# Patient Record
Sex: Male | Born: 1955 | Race: Black or African American | Smoking: Never smoker
Health system: Southern US, Community
[De-identification: ages and names within clinical notes are randomized; demographics above are authoritative.]

## PROBLEM LIST (undated history)

## (undated) DIAGNOSIS — E041 Nontoxic single thyroid nodule: Secondary | ICD-10-CM

## (undated) DIAGNOSIS — I10 Essential (primary) hypertension: Secondary | ICD-10-CM

---

## 2011-04-23 ENCOUNTER — Ambulatory Visit: Payer: Self-pay

## 2011-04-23 ENCOUNTER — Other Ambulatory Visit: Payer: Self-pay | Admitting: Occupational Medicine

## 2011-04-23 DIAGNOSIS — R52 Pain, unspecified: Secondary | ICD-10-CM

## 2012-12-02 IMAGING — CR DG ANKLE COMPLETE 3+V*R*
3 series · 3 of 3 positions shown · non-contrast
Comparison: Foot examination of same date.

***ADDENDUM*** CREATED: 04/23/2011 [DATE]

There is also a plantar calcaneal spur.
***END ADDENDUM*** SIGNED BY: Nilcilene Tenebroso
CLINICAL DATA: History of injury from fall with lateral pain.
RIGHT ANKLE - COMPLETE 3+ VIEW

[view not recorded (1 of 3)]
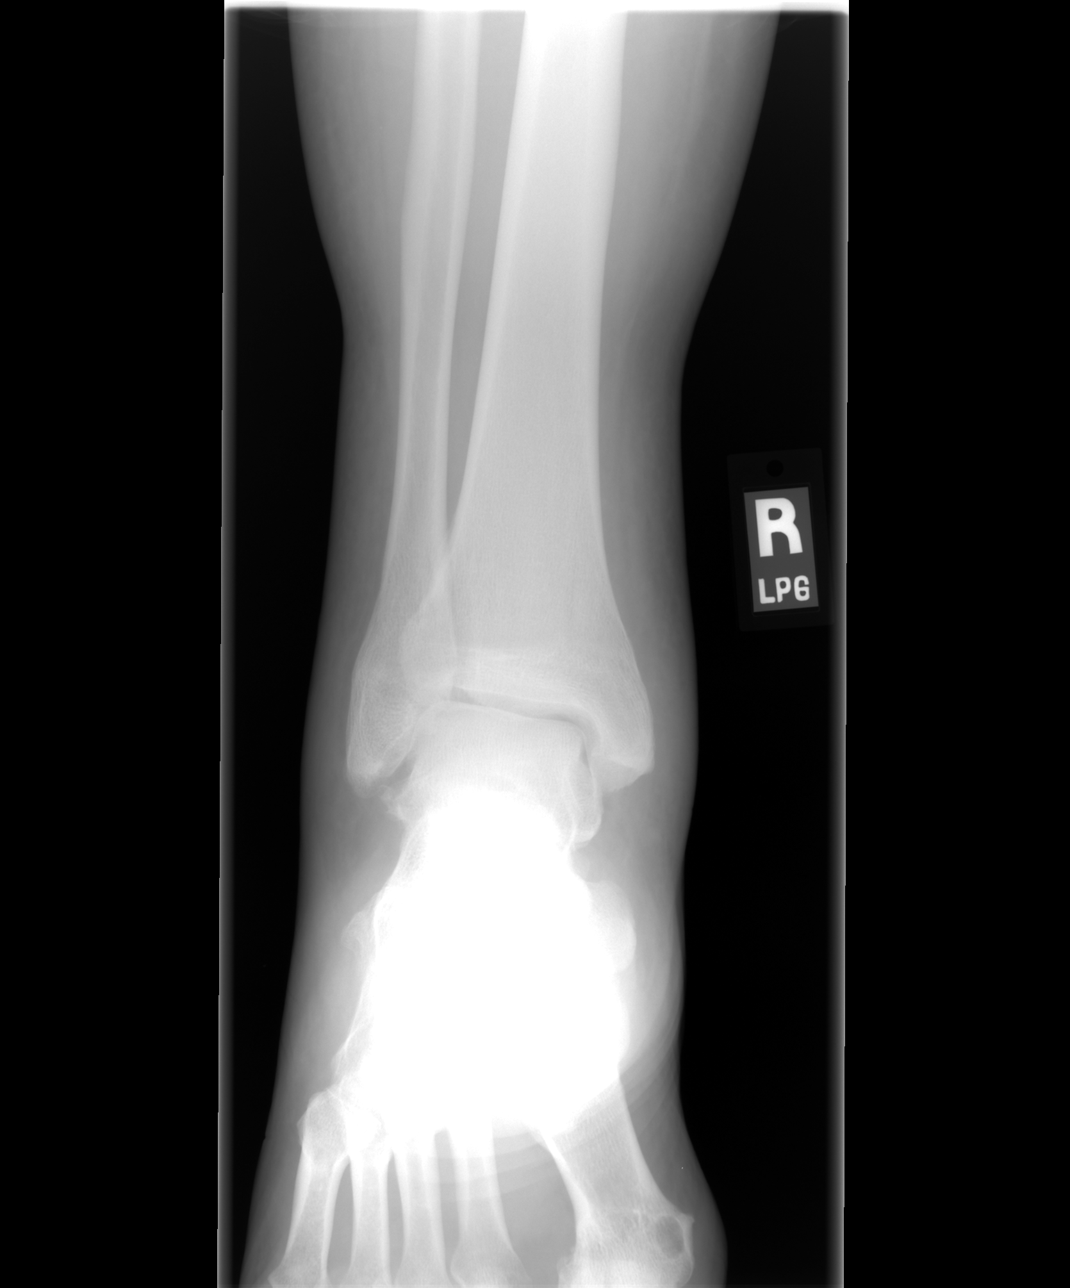

[view not recorded (2 of 3)]
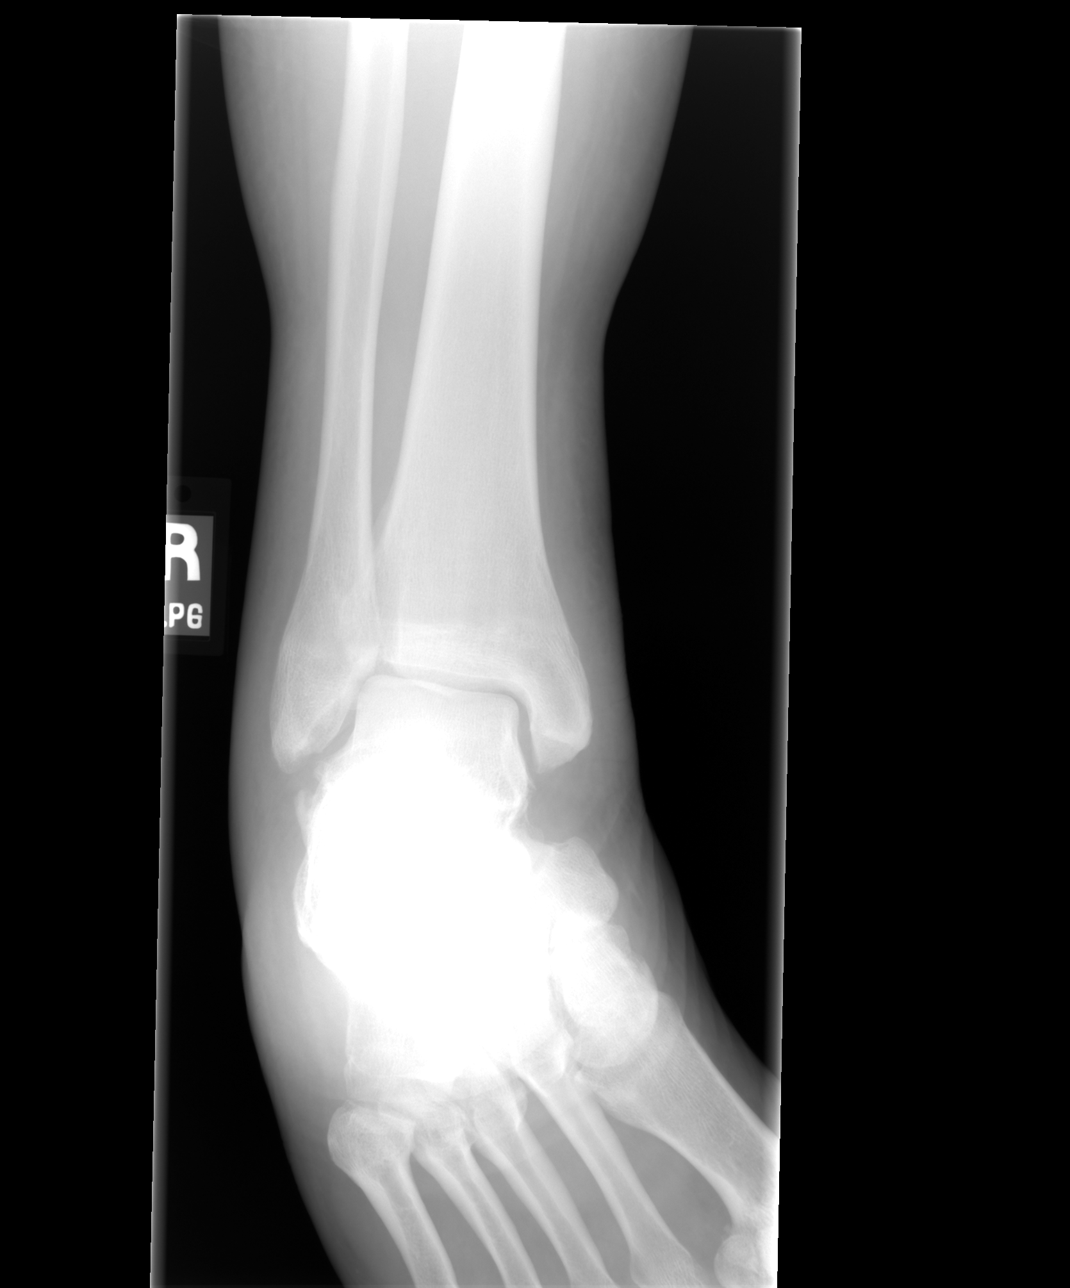

[view not recorded (3 of 3)]
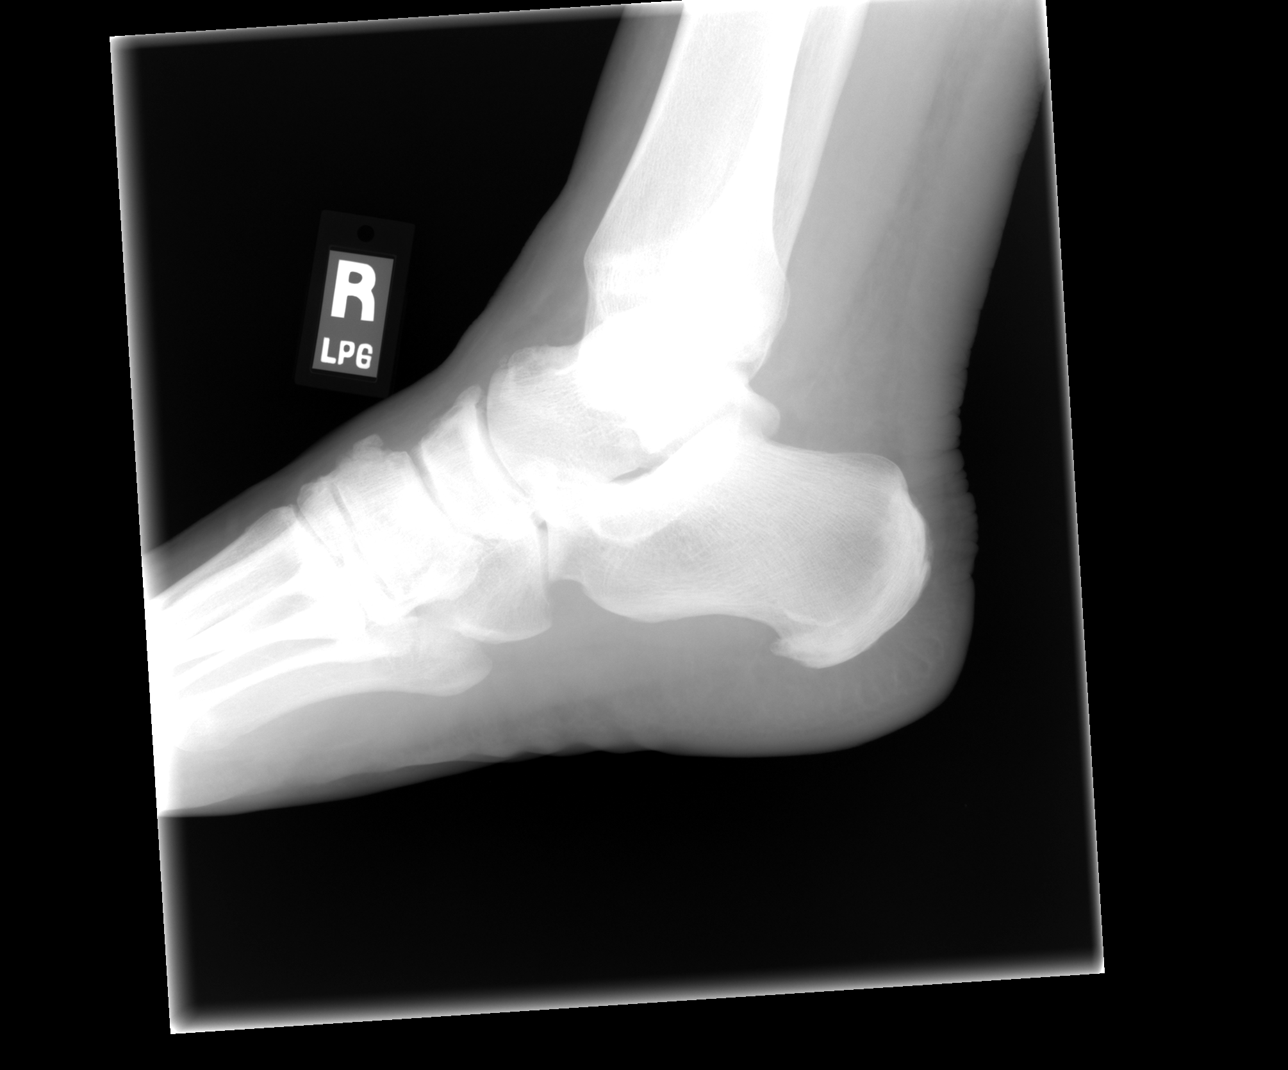

[3 of 3 positions shown; findings below may reference images not displayed]

FINDINGS: Mortise is preserved.  There is mild soft tissue
thickening or swelling.  No fracture or dislocation is evident.
Alignment is normal.  There is slight lateral spurring of the talus
and calcaneus.
IMPRESSION: Mild soft tissue swelling or thickening.  No fracture or
dislocation.

## 2012-12-02 IMAGING — CR DG FOOT COMPLETE 3+V*R*
3 series · 3 of 3 positions shown · non-contrast
Comparison: Right ankle examination of same date.

CLINICAL DATA: History of injury from fall.  Lateral foot pain.

RIGHT FOOT COMPLETE - 3+ VIEW

[view not recorded (1 of 3)]
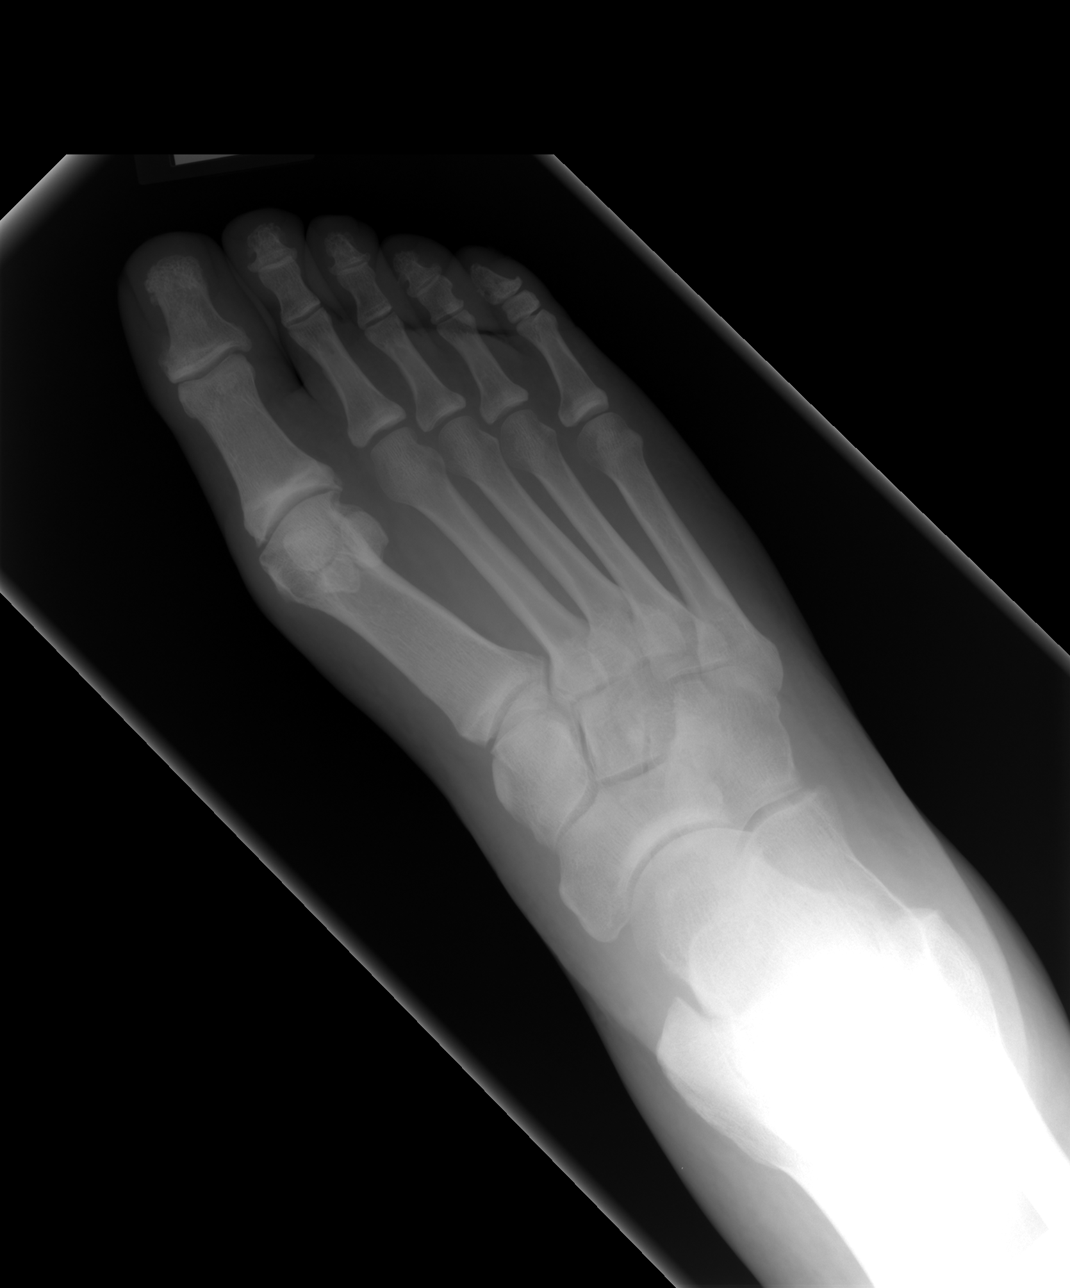

[view not recorded (2 of 3)]
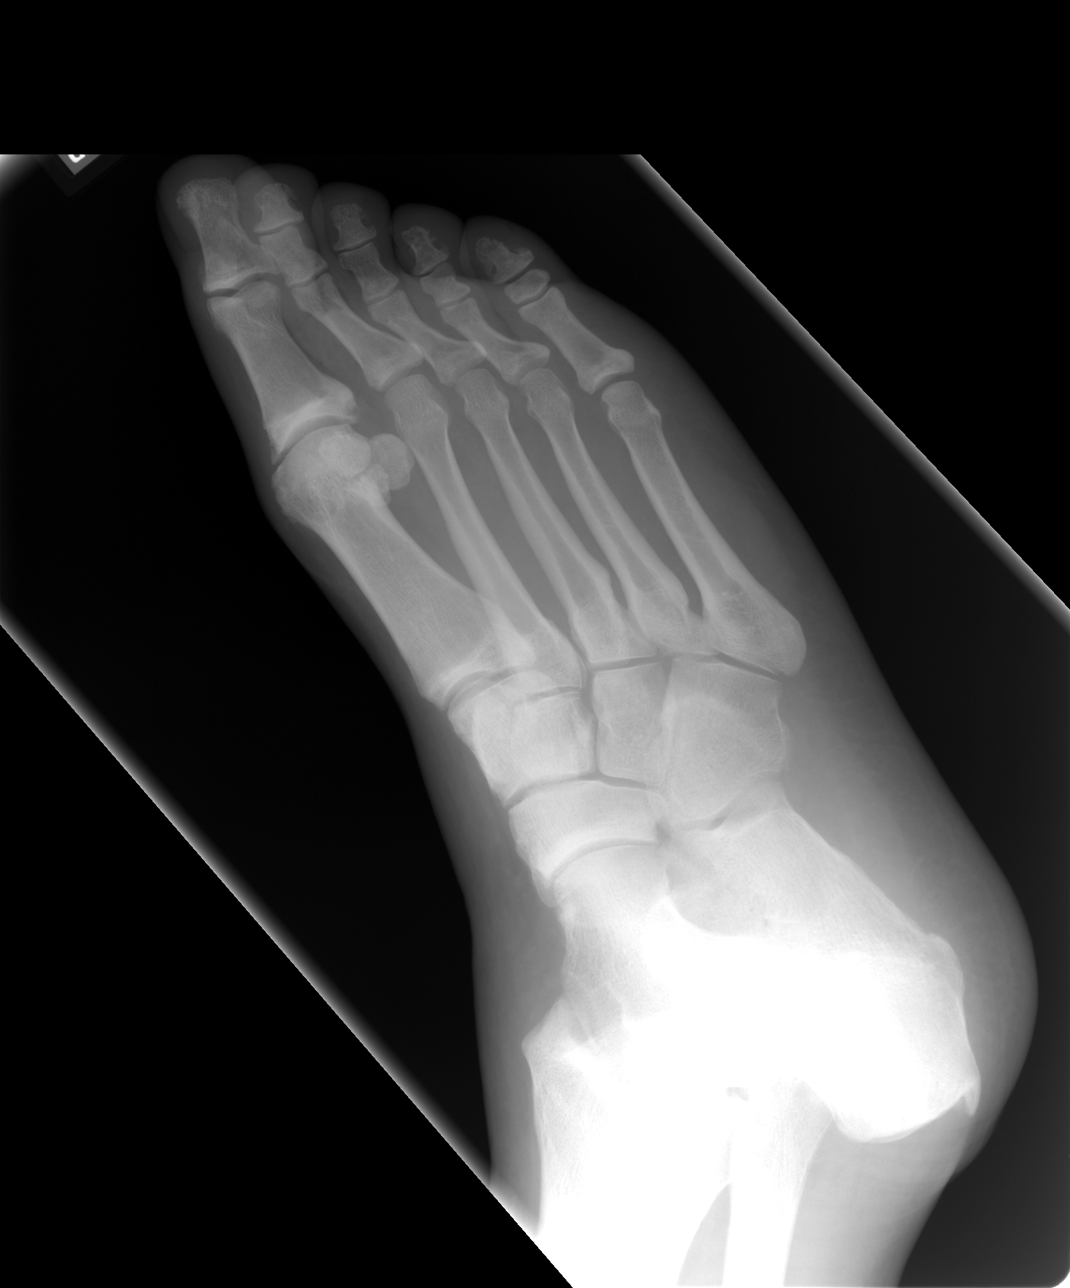

[view not recorded (3 of 3)]
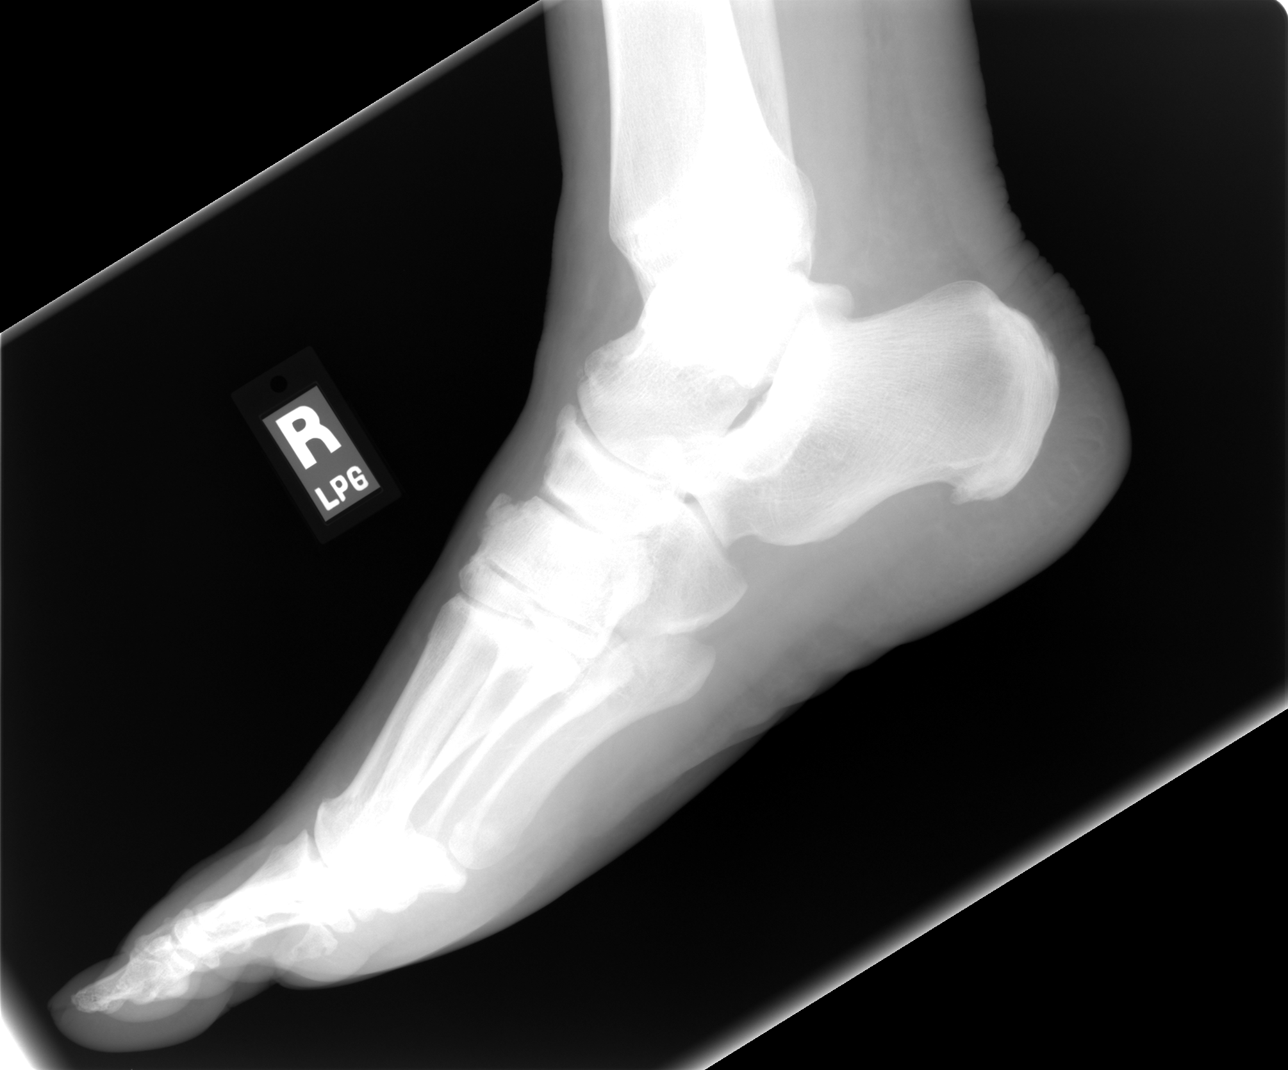

[3 of 3 positions shown; findings below may reference images not displayed]

FINDINGS: Alignment is normal.  No fracture or dislocation is
demonstrated.  There is narrowing of the first MTP joint with
marginal osteophyte formation representing osteoarthritic
degenerative joint disease. There is a plantar calcaneal spur.
IMPRESSION: No fracture dislocation.  First MTP joint osteoarthritic
degenerative joint changes.  Plantar calcaneal spur.

## 2013-11-07 ENCOUNTER — Other Ambulatory Visit: Payer: Self-pay | Admitting: Urology

## 2014-04-21 DIAGNOSIS — R3912 Poor urinary stream: Secondary | ICD-10-CM

## 2014-04-21 DIAGNOSIS — E042 Nontoxic multinodular goiter: Secondary | ICD-10-CM | POA: Insufficient documentation

## 2014-04-21 DIAGNOSIS — N401 Enlarged prostate with lower urinary tract symptoms: Secondary | ICD-10-CM | POA: Insufficient documentation

## 2014-05-11 DIAGNOSIS — N5201 Erectile dysfunction due to arterial insufficiency: Secondary | ICD-10-CM | POA: Insufficient documentation

## 2017-07-31 DIAGNOSIS — Z860101 Personal history of adenomatous and serrated colon polyps: Secondary | ICD-10-CM | POA: Insufficient documentation

## 2017-07-31 DIAGNOSIS — Z86010 Personal history of colonic polyps: Secondary | ICD-10-CM | POA: Insufficient documentation

## 2017-11-03 DIAGNOSIS — E669 Obesity, unspecified: Secondary | ICD-10-CM | POA: Insufficient documentation

## 2018-02-04 ENCOUNTER — Encounter: Payer: 59 | Admitting: Podiatry

## 2018-02-12 ENCOUNTER — Ambulatory Visit: Payer: 59

## 2018-02-12 ENCOUNTER — Ambulatory Visit: Payer: 59 | Admitting: Podiatry

## 2018-02-12 DIAGNOSIS — I10 Essential (primary) hypertension: Secondary | ICD-10-CM | POA: Insufficient documentation

## 2018-02-12 DIAGNOSIS — R7303 Prediabetes: Secondary | ICD-10-CM | POA: Insufficient documentation

## 2018-02-12 DIAGNOSIS — E785 Hyperlipidemia, unspecified: Secondary | ICD-10-CM | POA: Insufficient documentation

## 2018-02-12 DIAGNOSIS — Z9989 Dependence on other enabling machines and devices: Secondary | ICD-10-CM

## 2018-02-12 DIAGNOSIS — G4733 Obstructive sleep apnea (adult) (pediatric): Secondary | ICD-10-CM | POA: Insufficient documentation

## 2018-02-19 ENCOUNTER — Ambulatory Visit (INDEPENDENT_AMBULATORY_CARE_PROVIDER_SITE_OTHER): Payer: 59

## 2018-02-19 ENCOUNTER — Encounter: Payer: Self-pay | Admitting: Podiatry

## 2018-02-19 ENCOUNTER — Other Ambulatory Visit: Payer: Self-pay | Admitting: Podiatry

## 2018-02-19 ENCOUNTER — Ambulatory Visit: Payer: 59 | Admitting: Podiatry

## 2018-02-19 VITALS — BP 143/89 | HR 67 | Resp 16

## 2018-02-19 DIAGNOSIS — M722 Plantar fascial fibromatosis: Secondary | ICD-10-CM

## 2018-02-19 MED ORDER — TRIAMCINOLONE ACETONIDE 10 MG/ML IJ SUSP
10.0000 mg | Freq: Once | INTRAMUSCULAR | Status: AC
Start: 2018-02-19 — End: 2018-02-19
  Administered 2018-02-19: 10 mg

## 2018-02-19 MED ORDER — DICLOFENAC SODIUM 75 MG PO TBEC: 75.0000 mg | DELAYED_RELEASE_TABLET | Freq: Two times a day (BID) | ORAL | 2 refills | Status: DC

## 2018-02-19 NOTE — Patient Instructions (Signed)

## 2018-02-21 NOTE — Progress Notes (Signed)
Subjective:   Patient ID: Corey Hendricks, male   DOB: 62 y.o.   MRN: [id number]   HPI Patient presents with severe heel pain of both heels been present for several months and she had increased activity but does not remember any specific injury.  Patient does not smoke likes to be active   Review of Systems  All other systems reviewed and are negative.       Objective:  Physical Exam  Constitutional: He appears well-developed and well-nourished.  Cardiovascular: Intact distal pulses.  Pulmonary/Chest: Effort normal.  Musculoskeletal: Normal range of motion.  Neurological: He is alert.  Skin: Skin is warm.  Nursing note and vitals reviewed.   Neurovascular status intact muscle strength is adequate range of motion within normal limits with severe discomfort plantar aspect heel bilateral with inflammation fluid around the medial band.  Patient is found to have good digital perfusion and is well oriented x3     Assessment:  Acute plantar fasciitis bilateral heel with depression of the arch complicating the factor and the condition     Plan:  H&P x-ray reviewed and today I injected the plantar fascial bilateral 3 mg Kenalog 5 mg Xylocaine applied fascial brace bilateral gave instructions for physical therapy and supportive shoes and will be seen back again in the next several weeks  X-ray indicates that there is spur formation with no indication of stress fracture or advanced arthritis

## 2018-03-05 ENCOUNTER — Ambulatory Visit: Payer: 59 | Admitting: Podiatry

## 2018-03-12 ENCOUNTER — Ambulatory Visit: Payer: 59 | Admitting: Podiatry

## 2018-03-15 ENCOUNTER — Ambulatory Visit: Payer: 59 | Admitting: Podiatry

## 2018-05-24 NOTE — Progress Notes (Signed)
This encounter was created in error - please disregard.

## 2019-11-20 ENCOUNTER — Ambulatory Visit: Payer: 59 | Attending: Internal Medicine

## 2019-11-20 DIAGNOSIS — Z23 Encounter for immunization: Secondary | ICD-10-CM

## 2019-11-20 NOTE — Progress Notes (Signed)
   Covid-19 Vaccination Clinic  Name:  Corey Hendricks    MRN: [id number] DOB: 04-07-1956  11/20/2019  Corey Hendricks was observed post Covid-19 immunization for 15 minutes without incident. He was provided with Vaccine Information Sheet and instruction to access the V-Safe system.   Corey Hendricks was instructed to call 911 with any severe reactions post vaccine: Marland Kitchen Difficulty breathing  . Swelling of face and throat  . A fast heartbeat  . A bad rash all over body  . Dizziness and weakness   Immunizations Administered    Name Date Dose VIS Date Route   Pfizer COVID-19 Vaccine 11/20/2019 11:02 AM 0.3 mL 08/26/2019 Intramuscular   Manufacturer: [employer], [employer]   Lot: [id number]   NDC: [address]-1

## 2019-12-14 LAB — HEMOGLOBIN A1C: Hemoglobin A1C: 6 %

## 2019-12-14 LAB — TSH: TSH: 1.74 u[IU]/mL

## 2019-12-20 ENCOUNTER — Ambulatory Visit: Payer: 59 | Attending: Internal Medicine

## 2019-12-20 DIAGNOSIS — Z23 Encounter for immunization: Secondary | ICD-10-CM

## 2019-12-20 NOTE — Progress Notes (Signed)
   Covid-19 Vaccination Clinic  Name:  Jakyren Fluegge    MRN: [id number] DOB: 07/24/1956  12/20/2019  Mr. Yokley was observed post Covid-19 immunization for 15 minutes without incident. He was provided with Vaccine Information Sheet and instruction to access the V-Safe system.   Mr. Molina was instructed to call 911 with any severe reactions post vaccine: Marland Kitchen Difficulty breathing  . Swelling of face and throat  . A fast heartbeat  . A bad rash all over body  . Dizziness and weakness   Immunizations Administered    Name Date Dose VIS Date Route   Pfizer COVID-19 Vaccine 12/20/2019 11:37 AM 0.3 mL 08/26/2019 Intramuscular   Manufacturer: [employer], [employer]   Lot: [id number]   NDC: [address]-2

## 2020-06-20 LAB — CBC
Basophils %: 1 %
Basophils Absolute: 0.1 /??L
Eosinophils %: 2 %
Eosinophils Absolute: 0.1 /??L
Hematocrit: 45.5 % (ref 41–53)
Hemoglobin: 15 g/dL (ref 13.5–17.5)
Lymphocytes %: 34 %
Lymphocytes Absolute: 2.1 /??L
MCH: 26.9 pg
MCHC: 33 g/dL
MCV: 82 fL
Monocytes %: 9 %
Monocytes Absolute: 0.5 /??L
Neutrophils %: 54 %
Neutrophils Absolute: 3.3 /??L
Platelets: 164 K/??L
RBC: 5.57 10^6/??L
WBC: 6.2 10^3/mL

## 2020-06-20 LAB — COMPREHENSIVE METABOLIC PANEL
ALT: 26 U/L
AST: 22 U/L
Albumin: 4
Alkaline Phosphatase: 74 U/L
BUN: 8 mg/dL
CO2: 24 mmol/L
Calcium: 8.7 mg/dL
Chloride: 104 mmol/L
Creatinine: 1.04
Gfr Calculated: 77
Glucose: 94 mg/dL
Potassium: 4.2 mmol/L
Sodium: 140 mmol/L
Total Bilirubin: 0.4 mg/dL (ref 0.1–1.4)
Total Protein: 7.2

## 2020-06-20 LAB — HEMOGLOBIN A1C: Hemoglobin A1C: 6.1 %

## 2020-06-20 LAB — LIPID PANEL
Cholesterol, Total: 152 mg/dL
HDL: 41 mg/dL (ref 35–70)
LDL Calculated: 92 mg/dL (ref 0–160)
Triglycerides: 103 mg/dL
VLDL: 19 mg/dL

## 2020-06-20 LAB — PSA SCREENING: PSA: 0.6 ng/mL

## 2020-06-20 LAB — TSH: TSH: 1.09 u[IU]/mL

## 2021-04-12 ENCOUNTER — Ambulatory Visit
Admit: 2021-04-12 | Discharge: 2021-04-12 | Payer: BLUE CROSS/BLUE SHIELD | Attending: Physician Assistant | Primary: Physician Assistant

## 2021-04-12 DIAGNOSIS — Z0000 Encounter for general adult medical examination without abnormal findings: Secondary | ICD-10-CM

## 2021-04-12 NOTE — Progress Notes (Signed)
[facility]  [facility]  [address]  [address]  [place] [address]  Dept: [phone]  Dept Fax: [phone]    Theodore Webster is a 65 y.o. male who presents today for his medical conditions/complaints as noted below.    Chief Complaint   Patient presents with    Establish Care     Annual exam        HPI:     Patient presents to the office to establish care and for annual physical exam.  He is new to the area from [place].  He has past medical history including hypertension, obesity, obstructive sleep apnea.    Patient reports that he has been taking losartan 50 mg for the past few years for management of hypertension.  Denies any changes.  Denies any side effects.  Denies any lightheadedness, dizziness, shortness of breath, chest pain, leg edema, syncope.  Blood pressure in office today is slightly elevated.    He has history of obstructive sleep apnea.  He does not wear his CPAP device every night.    Patient reports that since moving to [place] he has gained around 10 pounds.  He states he has not been very active and conscious of dietary habits.  Non-smoker.  Minimal alcohol intake.    No specific concerns or complaints today.  He would like to obtain annual physical exam.  He reports he is up-to-date on vaccinations and health maintenance in [place].  Believes his last colonoscopy was in 2021.  I reviewed with patient his allergies come medications, past medical history, surgical history, family history, and social history.        No results found for: LABA1C          ( goal A1C is < 7)   No results found for: LABMICR  No results found for: LDLCHOLESTEROL, LDLCALC    (goal LDL is <100)   No results found for: AST, ALT, BUN, CR  BP Readings from Last 3 Encounters:   04/12/21 138/88          (goal 120/80)    Past Medical History:   Diagnosis Date    Hypertension       Past Surgical History:   Procedure Laterality Date    ACHILLES TENDON SURGERY  1992        Family History   Problem Relation Age of Onset    Diabetes Mother     High Blood Pressure Mother     Cancer Father        Social History     Tobacco Use    Smoking status: Never    Smokeless tobacco: Never   Substance Use Topics    Alcohol use: Not Currently     Comment: socially      Current Outpatient Medications   Medication Sig Dispense Refill    losartan (COZAAR) 50 MG tablet Take 50 mg by mouth in the morning.       No current facility-administered medications for this visit.     Allergies   Allergen Reactions    Penicillins Other (See Comments)     Unsure of reaction, childhood allergy       Health Maintenance   Topic Date Due    COVID-19 Vaccine (1) Never done    HIV screen  Never done    Hepatitis C screen  Never done    DTaP/Tdap/Td vaccine (1 - Tdap) Never done    Diabetes  screen  Never done    Lipids  Never done    Prostate Specific Antigen (PSA) Screening or Monitoring  Never done    Colorectal Cancer Screen  Never done    Shingles vaccine (1 of 2) Never done    Flu vaccine (1) 05/16/2021    Depression Screen  04/12/2022    Hepatitis A vaccine  Aged Out    Hepatitis B vaccine  Aged Out    Hib vaccine  Aged Out    Meningococcal (ACWY) vaccine  Aged Out    Pneumococcal 0-64 years Vaccine  Aged Out       Subjective:      Review of Systems   Constitutional:  Negative for chills, fatigue and fever.   HENT:  Negative for congestion, rhinorrhea and sinus pain.    Eyes:  Negative for pain.   Respiratory:  Negative for cough and shortness of breath.    Cardiovascular:  Negative for chest pain and leg swelling.   Gastrointestinal:  Negative for abdominal pain, constipation, diarrhea, nausea and vomiting.   Genitourinary:  Negative for difficulty urinating, enuresis and testicular pain.   Musculoskeletal:  Negative for arthralgias, back pain and myalgias.   Skin:  Negative for rash.   Neurological:  Negative for dizziness and headaches.   Psychiatric/Behavioral:  Negative for confusion and sleep disturbance.  The patient is not nervous/anxious.    All other systems reviewed and are negative.    Objective:     Physical Exam  Vitals and nursing note reviewed.   Constitutional:       General: He is not in acute distress.     Appearance: Normal appearance. He is obese.   HENT:      Head: Normocephalic.      Mouth/Throat:      Mouth: Mucous membranes are moist.   Eyes:      Extraocular Movements: Extraocular movements intact.      Conjunctiva/sclera: Conjunctivae normal.      Pupils: Pupils are equal, round, and reactive to light.   Cardiovascular:      Rate and Rhythm: Normal rate and regular rhythm.      Pulses: Normal pulses.      Heart sounds: Normal heart sounds.   Pulmonary:      Effort: Pulmonary effort is normal.      Breath sounds: Normal breath sounds.   Abdominal:      General: Abdomen is flat. Bowel sounds are normal.      Palpations: Abdomen is soft.      Tenderness: There is no abdominal tenderness.   Musculoskeletal:      Cervical back: Normal range of motion.      Right lower leg: No edema.      Left lower leg: No edema.   Lymphadenopathy:      Cervical: No cervical adenopathy.   Skin:     General: Skin is warm.      Capillary Refill: Capillary refill takes less than 2 seconds.   Neurological:      General: No focal deficit present.      Mental Status: He is alert and oriented to person, place, and time.   Psychiatric:         Mood and Affect: Mood normal.         Behavior: Behavior normal.     BP 138/88 (Site: Left Upper Arm, Position: Sitting, Cuff Size: Large Adult)    Pulse 67    Resp 16  Ht 6\' 2"  (1.88 m)    Wt (!) 301 lb (136.5 kg) Comment: 301.8 lb   SpO2 95%    BMI 38.65 kg/m??     Assessment:       ICD-10-CM    1. Annual physical exam  Z00.00       2. Encounter to establish care  Z76.89       3. Primary hypertension  I10       4. Screening for diabetes mellitus  Z13.1 Hemoglobin A1C      5. Screening PSA (prostate specific antigen)  Z12.5 PSA Screening      6. Screening for deficiency anemia  Z13.0  CBC with Auto Differential      7. Screening for thyroid disorder  Z13.29 TSH with Reflex      8. Encounter for lipid screening for cardiovascular disease  Z13.220 Lipid Panel    Z13.6 Comprehensive Metabolic Panel               Plan:      1,2.  Presents the office to establish care and for annual physical exam.  3.  Patient with history of hypertension.  He is to continue current losartan 50 mg once daily.  In addition, I discussed the importance of weight reduction, physical activity, sodium reduction, increasing potassium rich foods.  I recommended he obtain home blood pressure cuff and track BP regularly.4  4.  I discussed with patient the importance of CPAP device.  Advised that untreated sleep apnea can lead to elevated blood pressure and chronic diseases.  5-9.  Patient agreeable to several screening labs.  He has not had blood work in over a year.  Advised patient that we will most likely bring him back to the office after labs resulted.  10.  We discussed obesity and its role in chronic medical diseases.  I encouraged reducing carbohydrate intake, portion sizes, increasing physical activity.    Return in about 3 months (around 07/13/2021) for blood pressure, medication recheck.    Orders Placed This Encounter   Procedures    CBC with Auto Differential     Standing Status:   Future     Standing Expiration Date:   04/12/2022    TSH with Reflex     Standing Status:   Future     Standing Expiration Date:   04/12/2022    Lipid Panel     Standing Status:   Future     Standing Expiration Date:   07/13/2021     Order Specific Question:   Is Patient Fasting?/# of Hours     Answer:   Fast 8-10 hours    Comprehensive Metabolic Panel     Standing Status:   Future     Standing Expiration Date:   04/12/2022    PSA Screening     Standing Status:   Future     Standing Expiration Date:   04/12/2022    Hemoglobin A1C     Standing Status:   Future     Standing Expiration Date:   04/12/2022           Patient given educational  materials - see patient instructions.  Discussed use, benefit, and side effects of prescribed medications.  All patient questions answered. Pt voiced understanding. Reviewed health maintenance.  Instructed to continue current medications, diet and exercise.  Patient agreed with treatment plan. Follow up as directed.        Electronically signed by 04/14/2022, PA-C on 04/12/2021  at 11:22 AM

## 2021-04-18 ENCOUNTER — Inpatient Hospital Stay: Payer: BLUE CROSS/BLUE SHIELD | Primary: Physician Assistant

## 2021-04-18 DIAGNOSIS — Z125 Encounter for screening for malignant neoplasm of prostate: Secondary | ICD-10-CM

## 2021-04-18 LAB — CBC WITH AUTO DIFFERENTIAL
Absolute Eos #: 0.14 10*3/uL (ref 0.00–0.44)
Absolute Immature Granulocyte: <0.03 10*3/uL (ref 0.00–0.30)
Absolute Lymph #: 2.43 10*3/uL (ref 1.10–3.70)
Absolute Mono #: 0.6 10*3/uL (ref 0.10–1.20)
Basophils Absolute: 0.04 10*3/uL (ref 0.00–0.20)
Basophils: 1 % (ref 0–2)
Eosinophils %: 2 % (ref 1–4)
Hematocrit: 48.6 % (ref 40.7–50.3)
Hemoglobin: 14.8 g/dL (ref 13.0–17.0)
Immature Granulocytes: 0 %
Lymphocytes: 36 % (ref 24–43)
MCH: 26.8 pg (ref 25.2–33.5)
MCHC: 30.5 g/dL (ref 28.4–34.8)
MCV: 87.9 fL (ref 82.6–102.9)
MPV: 11.5 fL (ref 8.1–13.5)
Monocytes: 9 % (ref 3–12)
NRBC Automated: 0 per 100 WBC
Platelets: 179 10*3/uL (ref 138–453)
RBC: 5.53 m/uL (ref 4.21–5.77)
RDW: 15.6 % — ABNORMAL HIGH (ref 11.8–14.4)
Seg Neutrophils: 52 % (ref 36–65)
Segs Absolute: 3.59 10*3/uL (ref 1.50–8.10)
WBC: 6.8 10*3/uL (ref 3.5–11.3)

## 2021-04-18 LAB — COMPREHENSIVE METABOLIC PANEL
ALT: 35 U/L (ref 5–41)
AST: 44 U/L — ABNORMAL HIGH (ref <40)
Albumin/Globulin Ratio: 1.3 (ref 1.0–2.5)
Albumin: 4 g/dL (ref 3.5–5.2)
Alkaline Phosphatase: 63 U/L (ref 40–129)
Anion Gap: 12 mmol/L (ref 9–17)
BUN: 12 mg/dL (ref 8–23)
CO2: 23 mmol/L (ref 20–31)
Calcium: 8.7 mg/dL (ref 8.6–10.4)
Chloride: 103 mmol/L (ref 98–107)
Creatinine: 1.06 mg/dL (ref 0.70–1.20)
GFR African American: >60 mL/min (ref >60)
GFR Non-African American: >60 mL/min (ref >60)
Glucose: 101 mg/dL — ABNORMAL HIGH (ref 70–99)
Potassium: 5.1 mmol/L (ref 3.7–5.3)
Sodium: 138 mmol/L (ref 135–144)
Total Bilirubin: 0.46 mg/dL (ref 0.3–1.2)
Total Protein: 7.1 g/dL (ref 6.4–8.3)

## 2021-04-18 LAB — HEMOGLOBIN A1C
Estimated Avg Glucose: 128 mg/dL
Hemoglobin A1C: 6.1 % — ABNORMAL HIGH (ref 4.0–6.0)

## 2021-04-18 LAB — LIPID PANEL
Chol/HDL Ratio: 3.7 (ref <5)
Cholesterol: 156 mg/dL (ref <200)
HDL: 42 mg/dL (ref >40)
LDL Cholesterol: 92 mg/dL (ref 0–130)
Triglycerides: 112 mg/dL (ref <150)

## 2021-04-18 LAB — PSA SCREENING: PSA: 0.63 ng/mL (ref <4.1)

## 2021-04-18 LAB — TSH WITH REFLEX: TSH: 1.66 u[IU]/mL (ref 0.30–5.00)

## 2021-07-12 ENCOUNTER — Encounter: Attending: Physician Assistant | Primary: Physician Assistant

## 2021-07-12 ENCOUNTER — Encounter: Payer: BLUE CROSS/BLUE SHIELD | Attending: Physician Assistant | Primary: Physician Assistant

## 2021-07-18 ENCOUNTER — Ambulatory Visit
Admit: 2021-07-18 | Discharge: 2021-07-18 | Payer: BLUE CROSS/BLUE SHIELD | Attending: Physician Assistant | Primary: Physician Assistant

## 2021-07-18 DIAGNOSIS — I10 Essential (primary) hypertension: Secondary | ICD-10-CM

## 2021-07-18 NOTE — Progress Notes (Signed)
[facility]  [facility] [place] [facility]  [address]  [place] [address]  Dept: [phone]  Dept Fax: [phone]    Theodore Webster is a 65 y.o. male who presents today for his medical conditions/complaints as noted below.    Chief Complaint   Patient presents with    Hypertension       HPI:     Patient presents to the office for routine follow-up.  He has past medical history including hypertension, obstructive sleep apnea, obesity, prediabetes.    Today, patient reports he is doing very well without any new or acute concerns.  We reviewed most recent blood work which demonstrates A1c of 6.1.  Remainder within normal limits.  He denies any lightheadedness, dizziness, shortness of breath, chest pain, leg edema, syncope.  Weight has increased 5 pounds since last office visit.  He reports he has not maintained test of dietary habits.  Blood pressure today is borderline elevated.  He reports he did not take his losartan this morning.  He is also not wearing his CPAP device nightly.      Hemoglobin A1C (%)   Date Value   04/18/2021 6.1 (H)   06/20/2020 6.1   12/14/2019 6.0             ( goal A1C is < 7)   No results found for: LABMICR  LDL Cholesterol (mg/dL)   Date Value   56/97/9480 92     LDL Calculated (mg/dL)   Date Value   16/55/3748 92       (goal LDL is <100)   AST (U/L)   Date Value   04/18/2021 44 (H)     ALT (U/L)   Date Value   04/18/2021 35     BUN (mg/dL)   Date Value   27/03/8674 12     BP Readings from Last 3 Encounters:   07/18/21 138/88   04/12/21 138/88          (goal 120/80)    Past Medical History:   Diagnosis Date    Hypertension       Past Surgical History:   Procedure Laterality Date    ACHILLES TENDON SURGERY  1992       Family History   Problem Relation Age of Onset    Diabetes Mother     High Blood Pressure Mother     Cancer Father        Social History     Tobacco Use    Smoking status: Never    Smokeless tobacco: Never   Substance Use Topics    Alcohol use: Not  Currently     Comment: socially      Current Outpatient Medications   Medication Sig Dispense Refill    losartan (COZAAR) 50 MG tablet Take 50 mg by mouth in the morning.       No current facility-administered medications for this visit.     Allergies   Allergen Reactions    Penicillins Other (See Comments)     Unsure of reaction, childhood allergy       Health Maintenance   Topic Date Due    HIV screen  Never done    Hepatitis C screen  Never done    DTaP/Tdap/Td vaccine (1 - Tdap) Never done    Colorectal Cancer Screen  Never done    Shingles vaccine (1 of 2) Never done    COVID-19 Vaccine (2 - Pfizer series) 08/02/2021  A1C test (Diabetic or Prediabetic)  04/18/2022    Depression Screen  07/18/2022    Lipids  04/18/2026    Flu vaccine  Completed    Hepatitis A vaccine  Aged Out    Hib vaccine  Aged Out    Meningococcal (ACWY) vaccine  Aged Out    Pneumococcal 0-64 years Vaccine  Aged Out       Subjective:      Review of Systems   Constitutional:  Negative for chills, fatigue and fever.   HENT:  Negative for congestion, rhinorrhea and sinus pain.    Eyes:  Negative for pain.   Respiratory:  Negative for cough and shortness of breath.    Cardiovascular:  Negative for chest pain and leg swelling.   Gastrointestinal:  Negative for abdominal pain, constipation, diarrhea, nausea and vomiting.   Genitourinary:  Negative for difficulty urinating, enuresis and testicular pain.   Musculoskeletal:  Negative for arthralgias, back pain and myalgias.   Skin:  Negative for rash.   Neurological:  Negative for dizziness and headaches.   Psychiatric/Behavioral:  Negative for confusion and sleep disturbance. The patient is not nervous/anxious.    All other systems reviewed and are negative.    Objective:     Physical Exam  Vitals and nursing note reviewed.   Constitutional:       General: He is not in acute distress.     Appearance: Normal appearance.   HENT:      Head: Normocephalic.      Mouth/Throat:      Mouth: Mucous  membranes are moist.   Eyes:      Extraocular Movements: Extraocular movements intact.      Conjunctiva/sclera: Conjunctivae normal.      Pupils: Pupils are equal, round, and reactive to light.   Cardiovascular:      Rate and Rhythm: Normal rate and regular rhythm.      Pulses: Normal pulses.      Heart sounds: Normal heart sounds.   Pulmonary:      Effort: Pulmonary effort is normal.      Breath sounds: Normal breath sounds.   Abdominal:      General: Abdomen is flat.   Musculoskeletal:      Cervical back: Normal range of motion.      Right lower leg: No edema.      Left lower leg: No edema.   Lymphadenopathy:      Cervical: No cervical adenopathy.   Skin:     General: Skin is warm.      Capillary Refill: Capillary refill takes less than 2 seconds.   Neurological:      General: No focal deficit present.      Mental Status: He is alert and oriented to person, place, and time.   Psychiatric:         Mood and Affect: Mood normal.         Behavior: Behavior normal.     BP 138/88 (Site: Left Upper Arm, Position: Sitting, Cuff Size: Large Adult)    Pulse 71    Resp 16    Ht 6\' 2"  (1.88 m)    Wt (!) 306 lb (138.8 kg)    SpO2 96%    BMI 39.29 kg/m??     Assessment:       ICD-10-CM    1. Primary hypertension  I10       2. Prediabetes  R73.03       3. Class 2 severe obesity  due to excess calories with serious comorbidity and body mass index (BMI) of 38.0 to 38.9 in adult (HCC)  E66.01     Z68.38                Plan:      1.  Hypertension is borderline.  We discussed the importance of compliance with losartan 50 mg daily.  I discussed importance of compliance with CPAP device for obstructive sleep apnea.  I encourage patient on DASH diet.  2.  We discussed prediabetes.  He defers starting medication at this time such as metformin.  I strongly advise monitoring for excess sugars and carbohydrates.  3.  We discussed management of weight and its important for chronic medical diseases.  I strongly encouraged 30 minutes of daily  activity.    Return in about 4 months (around 11/15/2021) for medication recheck, blood pressure, prediabetes (A1C).    No orders of the defined types were placed in this encounter.        Patient given educational materials - see patient instructions.  Discussed use, benefit, and side effects of prescribed medications.  All patient questions answered. Pt voiced understanding. Reviewed health maintenance.  Instructed to continue current medications, diet and exercise.  Patient agreed with treatment plan. Follow up as directed.        Electronically signed by Madelyn Brunner, PA-C on 07/18/2021 at 3:41 PM

## 2021-09-27 ENCOUNTER — Encounter
Admit: 2021-09-27 | Discharge: 2021-09-27 | Payer: BLUE CROSS/BLUE SHIELD | Attending: Physician Assistant | Primary: Physician Assistant

## 2021-09-27 DIAGNOSIS — R7303 Prediabetes: Secondary | ICD-10-CM

## 2021-09-27 NOTE — Progress Notes (Signed)
[facility]  [facility] [place]  [address]  [place] [id number]  Dept: [phone]  Dept Fax: [phone]    Theodore Webster is a 66 y.o. male who presents today for his medical conditions/complaints as noted below.    Chief Complaint   Patient presents with    Thyroid Problem     Follow up       HPI:     Patient presents to the office to discuss thyroid concern.    He states he has noticed enlarged thyroid over the last few months.  He feels full sense of fullness when swallowing.  He reports back in 2019 he was seeing endocrinology due to thyroid issue.  He had ultrasound and biopsy.  He reports biopsy was benign growth.  He has not had any recent imaging.  Denies any symptoms of hyper or hypothyroidism including weight changes, bowel movement changes, energy level changes, elevated heart rate, anxiety.    Blood pressure is borderline today.  Denies any lightheadedness, dizziness, shortness of breath, chest pain, leg edema, syncope.  Weight slightly decreased from last office visit.      Hemoglobin A1C (%)   Date Value   04/18/2021 6.1 (H)   06/20/2020 6.1   12/14/2019 6.0             ( goal A1C is < 7)   No results found for: LABMICR  LDL Cholesterol (mg/dL)   Date Value   84/69/629508/12/2020 92     LDL Calculated (mg/dL)   Date Value   28/41/324410/02/2020 92       (goal LDL is <100)   AST (U/L)   Date Value   04/18/2021 44 (H)     ALT (U/L)   Date Value   04/18/2021 35     BUN (mg/dL)   Date Value   01/02/725308/12/2020 12     BP Readings from Last 3 Encounters:   09/27/21 130/86   07/18/21 138/88   04/12/21 138/88          (goal 120/80)    Past Medical History:   Diagnosis Date    Hypertension       Past Surgical History:   Procedure Laterality Date    ACHILLES TENDON SURGERY  1992       Family History   Problem Relation Age of Onset    Diabetes Mother     High Blood Pressure Mother     Cancer Father        Social History     Tobacco Use    Smoking status: Never    Smokeless tobacco: Never   Substance  Use Topics    Alcohol use: Not Currently     Comment: socially      Current Outpatient Medications   Medication Sig Dispense Refill    losartan (COZAAR) 50 MG tablet Take 50 mg by mouth in the morning.       No current facility-administered medications for this visit.     Allergies   Allergen Reactions    Penicillins Other (See Comments)     Unsure of reaction, childhood allergy       Health Maintenance   Topic Date Due    HIV screen  Never done    Hepatitis C screen  Never done    DTaP/Tdap/Td vaccine (1 - Tdap) Never done    Colorectal Cancer Screen  Never done    Shingles vaccine (1 of 2) Never done  COVID-19 Vaccine (2 - Pfizer series) 08/02/2021    A1C test (Diabetic or Prediabetic)  04/18/2022    Depression Screen  09/27/2022    Lipids  04/18/2026    Flu vaccine  Completed    Hepatitis A vaccine  Aged Out    Hib vaccine  Aged Out    Meningococcal (ACWY) vaccine  Aged Out    Pneumococcal 0-64 years Vaccine  Aged Out       Subjective:      Review of Systems   Constitutional:  Negative for chills, fatigue and fever.   HENT:  Negative for congestion, rhinorrhea and sinus pain.         + neck enlargment   Eyes:  Negative for pain.   Respiratory:  Negative for cough and shortness of breath.    Cardiovascular:  Negative for chest pain and leg swelling.   Gastrointestinal:  Negative for abdominal pain, constipation, diarrhea, nausea and vomiting.   Genitourinary:  Negative for difficulty urinating, enuresis and testicular pain.   Musculoskeletal:  Negative for arthralgias, back pain and myalgias.   Skin:  Negative for rash.   Neurological:  Negative for dizziness and headaches.   Psychiatric/Behavioral:  Negative for confusion and sleep disturbance. The patient is not nervous/anxious.    All other systems reviewed and are negative.    Objective:     Physical Exam  Vitals and nursing note reviewed.   Constitutional:       General: He is not in acute distress.     Appearance: Normal appearance. He is obese.   HENT:       Head: Normocephalic.      Right Ear: External ear normal.      Left Ear: External ear normal.      Nose: Nose normal. No congestion.      Mouth/Throat:      Mouth: Mucous membranes are moist.   Eyes:      Extraocular Movements: Extraocular movements intact.      Conjunctiva/sclera: Conjunctivae normal.      Pupils: Pupils are equal, round, and reactive to light.   Neck:      Thyroid: Thyromegaly and thyroid tenderness (minimal) present.   Cardiovascular:      Rate and Rhythm: Normal rate and regular rhythm.      Pulses: Normal pulses.      Heart sounds: Normal heart sounds.   Pulmonary:      Effort: Pulmonary effort is normal.      Breath sounds: Normal breath sounds.   Abdominal:      General: Abdomen is flat. Bowel sounds are normal.      Palpations: Abdomen is soft.      Tenderness: There is no abdominal tenderness.   Musculoskeletal:      Cervical back: Normal range of motion. No pain with movement, spinous process tenderness or muscular tenderness.      Right lower leg: No edema.      Left lower leg: No edema.   Lymphadenopathy:      Cervical: No cervical adenopathy.   Skin:     General: Skin is warm.      Capillary Refill: Capillary refill takes less than 2 seconds.   Neurological:      General: No focal deficit present.      Mental Status: He is alert and oriented to person, place, and time.   Psychiatric:         Mood and Affect: Mood normal.  Behavior: Behavior normal.     BP 130/86 (Site: Left Upper Arm, Position: Sitting, Cuff Size: Large Adult)    Pulse 66    Resp 16    Ht 6\' 2"  (1.88 m)    Wt 299 lb 4.8 oz (135.8 kg)    SpO2 95%    BMI 38.43 kg/m??     Assessment:       ICD-10-CM    1. Thyroid enlargement  E04.9 THYROID     TSH     T4, Free     T3, Free      2. Prediabetes  R73.03       3. Primary hypertension  I10                Plan:      1.  Patient with thyroid enlargement and history of biopsy.  Plan to reimage thyroid with ultrasound and assess thyroid function.  Pending results will  refer to Endo if necessary.  2.  With a long discussion about management of prediabetes.  I did encourage healthy dietary habits and active lifestyle.  3.  Patient with chronic hypertension.  Borderline today.  Continue losartan 50 mg daily.    Return if symptoms worsen or fail to improve.    Orders Placed This Encounter   Procedures    US THYROID     Standing Status:   Future     Standing Expiration Date:   09/27/2022     Order Specific Question:   Reason for exam:     Answer:   nodule/enlargement    TSH     Standing Status:   Future     Standing Expiration Date:   09/27/2022    T4, Free     Standing Status:   Future     Standing Expiration Date:   09/27/2022    T3, Free     Standing Status:   Future     Standing Expiration Date:   09/27/2022         Patient given educational materials - see patient instructions.  Discussed use, benefit, and side effects of prescribed medications.  All patient questions answered. Pt voiced understanding. Reviewed health maintenance.  Instructed to continue current medications, diet and exercise.  Patient agreed with treatment plan. Follow up as directed.        Electronically signed by 09/29/2022, PA-C on 10/02/2021 at 7:01 AM

## 2021-10-02 ENCOUNTER — Encounter: Payer: BLUE CROSS/BLUE SHIELD | Primary: Physician Assistant

## 2021-10-02 ENCOUNTER — Encounter

## 2021-10-02 LAB — T3, FREE: T3, Free: 2.83 pg/mL (ref 2.02–4.43)

## 2021-10-02 LAB — T4, FREE: Thyroxine, Free: 0.97 ng/dL (ref 0.93–1.70)

## 2021-10-02 LAB — TSH: TSH: 1.17 u[IU]/mL (ref 0.30–5.00)

## 2021-10-04 ENCOUNTER — Encounter

## 2021-10-04 ENCOUNTER — Inpatient Hospital Stay: Admit: 2021-10-04 | Payer: BLUE CROSS/BLUE SHIELD | Primary: Physician Assistant

## 2021-10-04 DIAGNOSIS — E041 Nontoxic single thyroid nodule: Secondary | ICD-10-CM

## 2021-10-04 NOTE — Progress Notes (Signed)
Patient to [place] for thyroid biopsy.  Dr. Nicola Girt PA and [facility] RDMS at bedside.  Site prepped and draped on left side, area numbed with lidocaine.  Access obtained and 4 FNA given to pathology.  Access removed and band aid placed to site.   Site prepped and draped for right side, area numbed with lidocaine.  Access obtained and 3 FNA given to pathology.  Access removed and band aid placed to site.  Patient tolerated well and is ambulatory from dept.                                                                             Patient tolerated well and is ambulatory from dept. Ice pack given for comfort.

## 2021-10-07 ENCOUNTER — Ambulatory Visit: Payer: BLUE CROSS/BLUE SHIELD | Primary: Physician Assistant

## 2021-10-07 LAB — SURGICAL PATHOLOGY REPORT

## 2021-10-24 ENCOUNTER — Encounter
Admit: 2021-10-24 | Discharge: 2021-10-24 | Payer: BLUE CROSS/BLUE SHIELD | Attending: Physician Assistant | Primary: Physician Assistant

## 2021-10-24 DIAGNOSIS — I10 Essential (primary) hypertension: Secondary | ICD-10-CM

## 2021-10-24 MED ORDER — METHYLPREDNISOLONE 4 MG PO TBPK
4 MG | PACK | ORAL | 0 refills | Status: AC
Start: 2021-10-24 — End: 2021-10-30

## 2021-10-24 MED ORDER — LOSARTAN POTASSIUM-HCTZ 50-12.5 MG PO TABS
ORAL_TABLET | Freq: Every day | ORAL | 0 refills | Status: AC
Start: 2021-10-24 — End: 2022-03-27

## 2021-10-24 NOTE — Progress Notes (Signed)
[facility]  [place] [address]  Dept: [phone]  Dept Fax: [phone]    Theodore Webster is a 66 y.o. male who presents today for his medical conditions/complaints as noted below.    Chief Complaint   Patient presents with    Numbness     Left ring and pinky finger, left middle finger will involuntarily bend x 1 month       HPI:     Presents to the office for evaluation of left hand.  Specifically, patient is having issues with left middle finger getting "caught/bent" involuntarily.  This happens randomly throughout the day.  There is slight discomfort associated with the catching sensation.  No history of similar symptoms.  He also has concerns for left pinky finger numbness/tingling.  It is not persistent and mostly with position.  He does work sitdown job frequently typing.  No other hand or upper extremity symptoms.  No radiating pain or numbness.  No neck discomfort.  No other neurological concerns.  He is right-hand dominant.  No history of injury or trauma.    No other complaints or concerns.  BP borderline elevated today.  Weight stable.      Hemoglobin A1C (%)   Date Value   04/18/2021 6.1 (H)   06/20/2020 6.1   12/14/2019 6.0             ( goal A1C is < 7)   No results found for: LABMICR  LDL Cholesterol (mg/dL)   Date Value   04/18/2021 92     LDL Calculated (mg/dL)   Date Value   06/20/2020 92       (goal LDL is <100)   AST (U/L)   Date Value   04/18/2021 44 (H)     ALT (U/L)   Date Value   04/18/2021 35     BUN (mg/dL)   Date Value   04/18/2021 12     BP Readings from Last 3 Encounters:   10/24/21 (!) 138/92   09/27/21 130/86   07/18/21 138/88          (goal 120/80)    Past Medical History:   Diagnosis Date    Hypertension       Past Surgical History:   Procedure Laterality Date    ACHILLES TENDON SURGERY  1992    US THYROID BIOPSY  10/04/2021    US THYROID BIOPSY 10/04/2021 [facility]       Family History   Problem Relation Age  of Onset    Diabetes Mother     High Blood Pressure Mother     Cancer Father        Social History     Tobacco Use    Smoking status: Never    Smokeless tobacco: Never   Substance Use Topics    Alcohol use: Not Currently     Comment: socially      Current Outpatient Medications   Medication Sig Dispense Refill    losartan-hydroCHLOROthiazide (HYZAAR) 50-12.5 MG per tablet Take 1 tablet by mouth daily 30 tablet 0    methylPREDNISolone (MEDROL DOSEPACK) 4 MG tablet Take by mouth. 1 kit 0    losartan (COZAAR) 50 MG tablet Take 50 mg by mouth in the morning.       No current facility-administered medications for this visit.     Allergies   Allergen Reactions    Penicillins Other (See Comments)  Unsure of reaction, childhood allergy       Health Maintenance   Topic Date Due    HIV screen  Never done    Hepatitis C screen  Never done    DTaP/Tdap/Td vaccine (1 - Tdap) Never done    Colorectal Cancer Screen  Never done    Shingles vaccine (1 of 2) Never done    COVID-19 Vaccine (2 - Pfizer series) 08/02/2021    A1C test (Diabetic or Prediabetic)  04/18/2022    Depression Screen  10/24/2022    Lipids  04/18/2026    Flu vaccine  Completed    Hepatitis A vaccine  Aged Out    Hib vaccine  Aged Out    Meningococcal (ACWY) vaccine  Aged Out    Pneumococcal 0-64 years Vaccine  Aged Out       Subjective:      Review of Systems   Constitutional:  Negative for chills, fatigue and fever.   HENT:  Negative for congestion, rhinorrhea and sinus pain.    Eyes:  Negative for pain.   Respiratory:  Negative for cough and shortness of breath.    Cardiovascular:  Negative for chest pain and leg swelling.   Gastrointestinal:  Negative for abdominal pain, constipation, diarrhea, nausea and vomiting.   Genitourinary:  Negative for difficulty urinating, enuresis and testicular pain.   Musculoskeletal:  Positive for arthralgias (left middle finger). Negative for back pain and myalgias.   Skin:  Negative for rash.   Neurological:  Positive for  numbness (left small finger). Negative for dizziness and headaches.   Psychiatric/Behavioral:  Negative for confusion and sleep disturbance. The patient is not nervous/anxious.    All other systems reviewed and are negative.    Objective:     Physical Exam  Vitals and nursing note reviewed.   Constitutional:       General: He is not in acute distress.     Appearance: Normal appearance.   HENT:      Head: Normocephalic.      Mouth/Throat:      Mouth: Mucous membranes are moist.   Eyes:      Extraocular Movements: Extraocular movements intact.      Conjunctiva/sclera: Conjunctivae normal.      Pupils: Pupils are equal, round, and reactive to light.   Cardiovascular:      Rate and Rhythm: Normal rate and regular rhythm.      Pulses: Normal pulses.      Heart sounds: Normal heart sounds.   Pulmonary:      Effort: Pulmonary effort is normal. No respiratory distress.      Breath sounds: Normal breath sounds.   Musculoskeletal:      Cervical back: Normal range of motion.      Right lower leg: No edema.      Left lower leg: No edema.      Comments: Left hand: No erythema, edema, ecchymosis.  Good range of motion.  Good grip strength.  No significant nodule felt at the A1 pulley of the left middle finger.  Slight tenderness to the flexor region.  Mild catching with range of motion.  Neurovascular intact.  Left elbow: Good range of motion.  No tenderness.  Positive Tinel's over the cubital tunnel.  Negative Tinel's over the carpal tunnel.  Right elbow and hand are within normal limits.   Lymphadenopathy:      Cervical: No cervical adenopathy.   Skin:     General: Skin is warm.  Capillary Refill: Capillary refill takes less than 2 seconds.   Neurological:      General: No focal deficit present.      Mental Status: He is alert and oriented to person, place, and time.      Cranial Nerves: No cranial nerve deficit.      Motor: No weakness.      Gait: Gait normal.   Psychiatric:         Mood and Affect: Mood normal.          Behavior: Behavior normal.     BP (!) 138/92 (Site: Left Upper Arm, Position: Sitting, Cuff Size: Large Adult)    Pulse 72    Resp 16    Ht _0  (1.88 m)    Wt 297 lb 6.4 oz (134.9 kg)    SpO2 95%    BMI 38.18 kg/m??     Assessment:       ICD-10-CM    1. Primary hypertension  I10 losartan-hydroCHLOROthiazide (HYZAAR) 50-12.5 MG per tablet      2. 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  Hypertension remains borderline elevated.  Plan to change losartan to losartan-hydrochlorothiazide 50-12.5 mg daily.  In addition we discussed the importance of weight management, DASH diet.  I recommended he is check blood pressure at home on a routine basis so we can get a larger sample size of control.  2.  Patient was suspected trigger finger of the left middle finger.  We discussed massage and range of motion exercises.  3.  Patient with suspected ulnar neuropathy at the left elbow.  I recommended ergonomic changes at work and protective sleeve.  He will also be given Medrol Dosepak.  If trigger finger and neuropathy continue will refer to hand specialist.    Return in about 4 weeks (around 11/21/2021) for medication recheck, blood pressure.    No orders of the defined types were placed in this encounter.        Patient given educational materials - see patient instructions.  Discussed use, benefit, and side effects of prescribed medications.  All patient questions answered. Pt voiced understanding. Reviewed health maintenance.  Instructed to continue current medications, diet and exercise.  Patient agreed with treatment plan. Follow up as directed.        Electronically signed by Derrill Center, PA-C on 10/24/2021 at 12:26 PM

## 2021-10-25 ENCOUNTER — Encounter: Payer: BLUE CROSS/BLUE SHIELD | Attending: Physician Assistant | Primary: Physician Assistant

## 2021-10-29 ENCOUNTER — Encounter: Payer: BLUE CROSS/BLUE SHIELD | Attending: Physician Assistant | Primary: Physician Assistant

## 2021-11-21 ENCOUNTER — Ambulatory Visit
Admit: 2021-11-21 | Discharge: 2021-11-21 | Payer: BLUE CROSS/BLUE SHIELD | Attending: Physician Assistant | Primary: Physician Assistant

## 2021-11-21 DIAGNOSIS — I10 Essential (primary) hypertension: Secondary | ICD-10-CM

## 2021-11-21 MED ORDER — AMLODIPINE BESYLATE 5 MG PO TABS
5 MG | ORAL_TABLET | Freq: Every day | ORAL | 0 refills | Status: AC
Start: 2021-11-21 — End: 2021-12-17

## 2021-11-21 NOTE — Progress Notes (Signed)
[facility]  [facility] [place] [facility]  [address]  [place] [address]  Dept: [phone]  Dept Fax: [phone]    Theodore Webster is a 66 y.o. male who presents today for his medical conditions/complaints as noted below.    Chief Complaint   Patient presents with    Hypertension       HPI:     Patient presents to the office for recheck of hypertension.  At last visit, we added hydrochlorothiazide 12.5 mg to his regimen.  Despite this addition, he continues to have elevated blood pressure.  He is remained asymptomatic with no lightheadedness, dizziness, shortness of breath, chest pain, leg edema, syncope.  No headaches.    He is compliant with blood pressure medication.  No side effects.  He reports mild quantity of caffeine intake.    He has not been tracking blood pressures at home.  I did recommend obtaining a home blood pressure cuff and daily monitoring/log.    No other complaints or concerns.        Hemoglobin A1C (%)   Date Value   04/18/2021 6.1 (H)   06/20/2020 6.1   12/14/2019 6.0             ( goal A1C is < 7)   No results found for: LABMICR  LDL Cholesterol (mg/dL)   Date Value   26/20/3559 92     LDL Calculated (mg/dL)   Date Value   74/16/3845 92       (goal LDL is <100)   AST (U/L)   Date Value   04/18/2021 44 (H)     ALT (U/L)   Date Value   04/18/2021 35     BUN (mg/dL)   Date Value   36/46/8032 12     BP Readings from Last 3 Encounters:   11/21/21 (!) 160/96   10/24/21 (!) 138/92   09/27/21 130/86          (goal 120/80)    Past Medical History:   Diagnosis Date    Hypertension       Past Surgical History:   Procedure Laterality Date    ACHILLES TENDON SURGERY  1992    US THYROID BIOPSY  10/04/2021    US THYROID BIOPSY 10/04/2021 [facility]       Family History   Problem Relation Age of Onset    Diabetes Mother     High Blood Pressure Mother     Cancer Father        Social History     Tobacco Use    Smoking status: Never    Smokeless tobacco: Never   Substance Use  Topics    Alcohol use: Not Currently     Comment: socially      Current Outpatient Medications   Medication Sig Dispense Refill    amLODIPine (NORVASC) 5 MG tablet Take 1 tablet by mouth daily 90 tablet 0    losartan-hydroCHLOROthiazide (HYZAAR) 50-12.5 MG per tablet Take 1 tablet by mouth daily 30 tablet 0     No current facility-administered medications for this visit.     Allergies   Allergen Reactions    Penicillins Other (See Comments)     Unsure of reaction, childhood allergy       Health Maintenance   Topic Date Due    HIV screen  Never done    Hepatitis C screen  Never done    DTaP/Tdap/Td vaccine (1 - Tdap) Never done  Colorectal Cancer Screen  Never done    Shingles vaccine (1 of 2) Never done    COVID-19 Vaccine (2 - Pfizer series) 08/02/2021    A1C test (Diabetic or Prediabetic)  04/18/2022    Depression Screen  11/22/2022    Lipids  04/18/2026    Flu vaccine  Completed    Hepatitis A vaccine  Aged Out    Hib vaccine  Aged Out    Meningococcal (ACWY) vaccine  Aged Out    Pneumococcal 0-64 years Vaccine  Aged Out       Subjective:      Review of Systems   Constitutional:  Negative for chills, fatigue and fever.   HENT:  Negative for congestion, rhinorrhea and sinus pain.    Eyes:  Negative for pain.   Respiratory:  Negative for cough and shortness of breath.    Cardiovascular:  Negative for chest pain and leg swelling.   Gastrointestinal:  Negative for abdominal pain, constipation, diarrhea, nausea and vomiting.   Genitourinary:  Negative for difficulty urinating, enuresis and testicular pain.   Musculoskeletal:  Negative for arthralgias, back pain and myalgias.   Skin:  Negative for rash.   Neurological:  Negative for dizziness and headaches.   Psychiatric/Behavioral:  Negative for confusion and sleep disturbance. The patient is not nervous/anxious.    All other systems reviewed and are negative.    Objective:     Physical Exam  Vitals and nursing note reviewed.   Constitutional:       General: He is  not in acute distress.     Appearance: Normal appearance. He is obese.   HENT:      Head: Normocephalic.      Mouth/Throat:      Mouth: Mucous membranes are moist.   Eyes:      Extraocular Movements: Extraocular movements intact.      Conjunctiva/sclera: Conjunctivae normal.      Pupils: Pupils are equal, round, and reactive to light.   Cardiovascular:      Rate and Rhythm: Normal rate and regular rhythm.      Pulses: Normal pulses.      Heart sounds: Normal heart sounds.   Pulmonary:      Effort: Pulmonary effort is normal. No respiratory distress.      Breath sounds: Normal breath sounds.   Musculoskeletal:      Cervical back: Normal range of motion.      Right lower leg: No edema.      Left lower leg: No edema.   Lymphadenopathy:      Cervical: No cervical adenopathy.   Skin:     General: Skin is warm.      Capillary Refill: Capillary refill takes less than 2 seconds.      Coloration: Skin is not jaundiced.   Neurological:      General: No focal deficit present.      Mental Status: He is alert and oriented to person, place, and time.   Psychiatric:         Mood and Affect: Mood normal.         Behavior: Behavior normal.     BP (!) 160/96 (Site: Left Upper Arm, Position: Sitting, Cuff Size: Large Adult)    Pulse 68    Resp 16    Ht 6\' 2"  (1.88 m)    Wt 296 lb 3.2 oz (134.4 kg)    SpO2 98%    BMI 38.03 kg/m??     Assessment:  ICD-10-CM    1. Primary hypertension  I10 amLODIPine (NORVASC) 5 MG tablet      2. Class 2 severe obesity due to excess calories with serious comorbidity and body mass index (BMI) of 38.0 to 38.9 in adult [facility](HCC)  E66.01     Z68.38                Plan:      1,2.  Patient with persistent hypertension.  Plan to add amlodipine 5 mg daily to current regimen.  In addition he is to continue losartan-hydrochlorothiazide 50-12.5 mg daily.  We also discussed the importance of DASH diet, weight management, light activity, proper sleep.    Return in about 4 weeks (around 12/19/2021) for medication  recheck.    No orders of the defined types were placed in this encounter.        Patient given educational materials - see patient instructions.  Discussed use, benefit, and side effects of prescribed medications.  All patient questions answered. Pt voiced understanding. Reviewed health maintenance.  Instructed to continue current medications, diet and exercise.  Patient agreed with treatment plan. Follow up as directed.        Electronically signed by Madelyn Brunnerrew Kia Varnadore, PA-C on 11/22/2021 at 7:19 AM

## 2021-12-17 ENCOUNTER — Ambulatory Visit
Admit: 2021-12-17 | Discharge: 2021-12-17 | Payer: BLUE CROSS/BLUE SHIELD | Attending: Family | Primary: Physician Assistant

## 2021-12-17 DIAGNOSIS — I10 Essential (primary) hypertension: Secondary | ICD-10-CM

## 2021-12-17 MED ORDER — AMLODIPINE BESYLATE 10 MG PO TABS
10 MG | ORAL_TABLET | Freq: Every day | ORAL | 1 refills | Status: DC
Start: 2021-12-17 — End: 2022-07-07

## 2021-12-17 NOTE — Progress Notes (Signed)
[facility]  [facility] [place] [facility]  [address]  [place] [address]  Dept: [phone]  Dept Fax: [phone]    Theodore Webster is a 66 y.o. male who presentstoday for his medical conditions/complaints as noted below.  [name] is c/o of  Chief Complaint   Patient presents with    Hypertension    Health Maintenance     Declined         HPI:     Here for 1 month BP check, is est pt of Theodore Brunner PA-C  Uncontrolled on novasc 5mg  and hyzaar 50-12.5  Denies CP, SOB, leg swelling or HA  Has 1 home reading of 111/74 that was just a few days ago   Today BP improved but remained elevated on recheck   Has 1 cup of coffee in morning, no nicotine use       Hemoglobin A1C (%)   Date Value   04/18/2021 6.1 (H)   06/20/2020 6.1   12/14/2019 6.0             ( goal A1C is < 7)   No results found for: LABMICR  LDL Cholesterol (mg/dL)   Date Value   12/16/2019 92     LDL Calculated (mg/dL)   Date Value   09/32/3557 92       (goal LDL is <100)   AST (U/L)   Date Value   04/18/2021 44 (H)     ALT (U/L)   Date Value   04/18/2021 35     BUN (mg/dL)   Date Value   06/18/2021 12     BP Readings from Last 3 Encounters:   12/17/21 (!) 140/82   11/21/21 (!) 160/96   10/24/21 (!) 138/92          (goal120/80)    Past Medical History:   Diagnosis Date    Hypertension       Past Surgical History:   Procedure Laterality Date    ACHILLES TENDON SURGERY  1992    12/22/21 THYROID BIOPSY  10/04/2021    10/06/2021 THYROID BIOPSY 10/04/2021 [facility] ULTRASOUND       Family History   Problem Relation Age of Onset    Diabetes Mother     High Blood Pressure Mother     Cancer Father        Social History     Tobacco Use    Smoking status: Never    Smokeless tobacco: Never   Substance Use Topics    Alcohol use: Not Currently     Comment: socially      Current Outpatient Medications   Medication Sig Dispense Refill    amLODIPine (NORVASC) 10 MG tablet Take 1 tablet by mouth daily 90 tablet 1    losartan-hydroCHLOROthiazide (HYZAAR) 50-12.5  MG per tablet Take 1 tablet by mouth daily 30 tablet 0     No current facility-administered medications for this visit.     Allergies   Allergen Reactions    Penicillins Other (See Comments)     Unsure of reaction, childhood allergy       Health Maintenance   Topic Date Due    HIV screen  Never done    Hepatitis C screen  Never done    DTaP/Tdap/Td vaccine (1 - Tdap) Never done    Colorectal Cancer Screen  Never done    Shingles vaccine (1 of 2) Never done    COVID-19 Vaccine (2 - Pfizer series)  08/02/2021    A1C test (Diabetic or Prediabetic)  04/18/2022    Depression Screen  11/22/2022    Lipids  04/18/2026    Flu vaccine  Completed    Hepatitis A vaccine  Aged Out    Hib vaccine  Aged Out    Meningococcal (ACWY) vaccine  Aged Out    Pneumococcal 0-64 years Vaccine  Aged Out    Diabetes screen  Discontinued    Prostate Specific Antigen (PSA) Screening or Monitoring  Discontinued       Subjective:      Review of Systems   Constitutional:  Negative for activity change, fatigue and fever.   HENT:  Negative for congestion, rhinorrhea and sore throat.    Eyes:  Negative for visual disturbance.   Respiratory:  Negative for chest tightness and shortness of breath.    Cardiovascular:  Negative for chest pain and palpitations.   Gastrointestinal:  Negative for abdominal pain, diarrhea, nausea and vomiting.   Endocrine: Negative for polydipsia.   Genitourinary:  Negative for difficulty urinating.   Musculoskeletal:  Negative for arthralgias and myalgias.   Skin:  Negative for color change.   Neurological:  Negative for weakness and headaches.   Psychiatric/Behavioral:  Negative for behavioral problems. The patient is not nervous/anxious.    All other systems reviewed and are negative.    Objective:   BP (!) 140/82   Pulse 68   Resp 16   Wt (!) 301 lb (136.5 kg)   SpO2 98%   BMI 38.65 kg/m   Physical Exam  Vitals reviewed.   Constitutional:       General: He is not in acute distress.     Appearance: Normal appearance.    HENT:      Head: Normocephalic.   Eyes:      Pupils: Pupils are equal, round, and reactive to light.   Cardiovascular:      Rate and Rhythm: Normal rate and regular rhythm.      Pulses: Normal pulses.      Heart sounds: Normal heart sounds.   Pulmonary:      Effort: Pulmonary effort is normal.      Breath sounds: Normal breath sounds.   Abdominal:      General: There is no distension.   Musculoskeletal:         General: Normal range of motion.      Right lower leg: No edema.      Left lower leg: No edema.   Skin:     General: Skin is warm and dry.      Capillary Refill: Capillary refill takes less than 2 seconds.   Neurological:      General: No focal deficit present.      Mental Status: He is alert and oriented to person, place, and time.   Psychiatric:         Mood and Affect: Mood normal.         Behavior: Behavior normal.         :       Diagnosis Orders   1. Primary hypertension  amLODIPine (NORVASC) 10 MG tablet                :          1. Primary hypertension  Uncontrolled, continue hyzaar 50-12.5 and increase norvasc to 10mg  daily. Continue low salt diet and exercise. Advised to check BP 3-4 times per week and record, return sooner if above 140/90  otherwise ok to see back PCP 3 months.     - amLODIPine (NORVASC) 10 MG tablet; Take 1 tablet by mouth daily  Dispense: 90 tablet; Refill: 1    Return in about 3 months (around 03/18/2022) for Hypertension.     Patient given educational materials - see patient instructions.  Discussed use, benefit, and side effects of prescribed medications.  All patient questions answered. Pt voiced understanding. Reviewed health maintenance.  Instructed to continue current medications, diet and exercise.  Patient agreed with treatment plan. Follow up as directed.       Electronicallysigned by Sebastian Ache, APRN - CNP on 12/17/2021 at 4:41 PM

## 2022-03-27 ENCOUNTER — Ambulatory Visit
Admit: 2022-03-27 | Discharge: 2022-03-27 | Payer: BLUE CROSS/BLUE SHIELD | Attending: Physician Assistant | Primary: Physician Assistant

## 2022-03-27 DIAGNOSIS — I10 Essential (primary) hypertension: Secondary | ICD-10-CM

## 2022-03-27 MED ORDER — HYDROCHLOROTHIAZIDE 12.5 MG PO CAPS
12.5 MG | ORAL_CAPSULE | Freq: Every morning | ORAL | 1 refills | Status: AC
Start: 2022-03-27 — End: ?

## 2022-03-27 NOTE — Progress Notes (Signed)
[facility]  [facility] [place] [facility]  [address]  [place] [address]  Dept: [phone]  Dept Fax: [phone]    Theodore Webster is a 66 y.o. male who presents today for his medical conditions/complaints as noted below.    Chief Complaint   Patient presents with    Hypertension    Elbow Pain     Left elbow x 1 week on and off, noticing certain movements cause pain       HPI:     Patient presents to the office for routine follow-up.  He has past medical history including hypertension, obstructive sleep apnea, prediabetes, obesity.    Patient does have concerns for left elbow pain.  This started 1 or 2 weeks ago.  No injury or trauma.  No radiating pain.  Pain is concentrated to the posterior elbow near the triceps.  Only certain movements such as pushing activity will flareup pain.  It is intermittent and not currently present.  Denies any numbness or tingling.  No right-sided symptoms.    Otherwise, patient reports he is doing fairly well.  He is working on weight loss with dietary changes.  He is down 5 pounds from last office visit.  After discussion, he is only taking 1 blood pressure pill, the amlodipine.  He has not been taking losartan-hydrochlorothiazide for a couple months.  Denies any lightheadedness, dizziness, shortness of breath, chest pain, leg edema, syncope.  Blood pressure is borderline.        Hemoglobin A1C (%)   Date Value   04/18/2021 6.1 (H)   06/20/2020 6.1   12/14/2019 6.0             ( goal A1C is < 7)   No results found for: LABMICR  LDL Cholesterol (mg/dL)   Date Value   13/04/6577 92     LDL Calculated (mg/dL)   Date Value   46/96/2952 92       (goal LDL is <100)   AST (U/L)   Date Value   04/18/2021 44 (H)     ALT (U/L)   Date Value   04/18/2021 35     BUN (mg/dL)   Date Value   84/13/2440 12     BP Readings from Last 3 Encounters:   03/27/22 130/86   12/17/21 (!) 140/82   11/21/21 (!) 160/96          (goal 120/80)    Past Medical History:   Diagnosis Date     Hypertension       Past Surgical History:   Procedure Laterality Date    ACHILLES TENDON SURGERY  1992    US THYROID BIOPSY  10/04/2021    US THYROID BIOPSY 10/04/2021 [facility] ULTRASOUND       Family History   Problem Relation Age of Onset    Diabetes Mother     High Blood Pressure Mother     Cancer Father        Social History     Tobacco Use    Smoking status: Never    Smokeless tobacco: Never   Substance Use Topics    Alcohol use: Not Currently     Comment: socially      Current Outpatient Medications   Medication Sig Dispense Refill    hydroCHLOROthiazide (MICROZIDE) 12.5 MG capsule Take 1 capsule by mouth every morning 90 capsule 1    amLODIPine (NORVASC) 10 MG tablet Take 1 tablet by mouth daily 90 tablet 1  No current facility-administered medications for this visit.     Allergies   Allergen Reactions    Penicillins Other (See Comments)     Unsure of reaction, childhood allergy       Health Maintenance   Topic Date Due    HIV screen  Never done    Hepatitis C screen  Never done    DTaP/Tdap/Td vaccine (1 - Tdap) Never done    Colorectal Cancer Screen  Never done    Shingles vaccine (1 of 2) Never done    A1C test (Diabetic or Prediabetic)  04/18/2022    Flu vaccine (1) 04/15/2022    Depression Screen  03/28/2023    Lipids  04/18/2026    COVID-19 Vaccine  Completed    Hepatitis A vaccine  Aged Out    Hib vaccine  Aged Out    Meningococcal (ACWY) vaccine  Aged Out    Pneumococcal 0-64 years Vaccine  Aged Out    Diabetes screen  Discontinued    Prostate Specific Antigen (PSA) Screening or Monitoring  Discontinued       Subjective:      Review of Systems   Constitutional:  Negative for chills, fatigue and fever.   HENT:  Negative for congestion, rhinorrhea and sinus pain.    Eyes:  Negative for pain.   Respiratory:  Negative for cough and shortness of breath.    Cardiovascular:  Negative for chest pain and leg swelling.   Gastrointestinal:  Negative for abdominal pain, constipation, diarrhea, nausea and vomiting.    Genitourinary:  Negative for difficulty urinating, enuresis and testicular pain.   Musculoskeletal:  Positive for arthralgias. Negative for back pain and myalgias.   Skin:  Negative for rash.   Neurological:  Negative for dizziness and headaches.   Psychiatric/Behavioral:  Negative for confusion and sleep disturbance. The patient is not nervous/anxious.    All other systems reviewed and are negative.    Objective:     Physical Exam  Vitals and nursing note reviewed.   Constitutional:       General: He is not in acute distress.     Appearance: Normal appearance. He is obese.   HENT:      Head: Normocephalic.      Mouth/Throat:      Mouth: Mucous membranes are moist.   Eyes:      Extraocular Movements: Extraocular movements intact.      Conjunctiva/sclera: Conjunctivae normal.      Pupils: Pupils are equal, round, and reactive to light.   Cardiovascular:      Rate and Rhythm: Normal rate and regular rhythm.      Pulses: Normal pulses.      Heart sounds: Normal heart sounds.   Pulmonary:      Effort: Pulmonary effort is normal.      Breath sounds: Normal breath sounds.   Abdominal:      General: Abdomen is flat. Bowel sounds are normal.      Palpations: Abdomen is soft.      Tenderness: There is no abdominal tenderness.   Musculoskeletal:      Cervical back: Normal range of motion.      Right lower leg: No edema.      Left lower leg: No edema.      Comments: Left elbow: No erythema, edema, ecchymosis.  Full range of motion.  Good strength with flexion and extension.  No pain over the medial or lateral epicondyle.  No olecranon process pain.  No  biceps tendon tenderness.  No pain with resisted extension.  No triceps tenderness today.  No pain with resistance to wrist flexion or extension.  Neurovascularly intact.   Lymphadenopathy:      Cervical: No cervical adenopathy.   Skin:     General: Skin is warm.      Capillary Refill: Capillary refill takes less than 2 seconds.   Neurological:      General: No focal deficit  present.      Mental Status: He is alert and oriented to person, place, and time.   Psychiatric:         Mood and Affect: Mood normal.         Behavior: Behavior normal.     BP 130/86 (Site: Left Upper Arm, Position: Sitting, Cuff Size: Large Adult)   Pulse 63   Resp 16   Ht 6\' 2"  (1.88 m)   Wt 297 lb 9.6 oz (135 kg)   SpO2 95%   BMI 38.21 kg/m     Assessment:       ICD-10-CM    1. Primary hypertension  I10 Comprehensive Metabolic Panel     Lipid Panel     CBC     hydroCHLOROthiazide (MICROZIDE) 12.5 MG capsule      2. Prediabetes  R73.03 Hemoglobin A1C      3. Tendinitis of left triceps  M77.8       4. Screening PSA (prostate specific antigen)  Z12.5 PSA Screening               Plan:      1.  Blood pressure is borderline today.  After discussion he is only taking amlodipine 10 mg.  Plan to start hydrochlorothiazide 12.5 mg daily in addition.  We discussed compliance.  Discussed regular activity, weight management, healthy nutrition.  2.  Strongly encouraged reducing processed foods and concentrated carbohydrates to improve prediabetes.  3.  Patient with suspected tendinitis of left triceps.  We discussed home exercises, ice, Tylenol as needed.  If symptoms persist we will consider physical therapy.  4.  Order screening PSA.    Return in about 4 months (around 07/28/2022) for medication recheck, blood pressure.    Orders Placed This Encounter   Procedures    Comprehensive Metabolic Panel     Standing Status:   Future     Standing Expiration Date:   03/28/2023    PSA Screening     Standing Status:   Future     Standing Expiration Date:   03/28/2023    Hemoglobin A1C     Standing Status:   Future     Standing Expiration Date:   03/28/2023    Lipid Panel     Standing Status:   Future     Standing Expiration Date:   03/28/2023    CBC     Standing Status:   Future     Standing Expiration Date:   03/28/2023         Patient given educational materials - see patient instructions.  Discussed use, benefit, and side effects  of prescribed medications.  All patient questions answered. Pt voiced understanding. Reviewed health maintenance.  Instructed to continue current medications, diet and exercise.  Patient agreed with treatment plan. Follow up as directed.        Electronically signed by 03/30/2023, PA-C on 03/28/2022 at 7:18 AM

## 2022-07-06 ENCOUNTER — Encounter

## 2022-07-07 MED ORDER — AMLODIPINE BESYLATE 10 MG PO TABS
10 MG | ORAL_TABLET | Freq: Every day | ORAL | 1 refills | Status: AC
Start: 2022-07-07 — End: ?

## 2022-07-07 NOTE — Telephone Encounter (Signed)
Theodore Webster is requesting a refill on the following medication(s):  Requested Prescriptions     Pending Prescriptions Disp Refills    losartan-hydroCHLOROthiazide (HYZAAR) 50-12.5 MG per tablet [Pharmacy Med Name: LOSARTAN/HCTZ 50/12.5MG  TABLETS] 30 tablet 0     Sig: TAKE 1 TABLET BY MOUTH DAILY       Last Visit Date (If Applicable):  9/37/1696    Next Visit Date:    07/30/2022

## 2022-07-07 NOTE — Telephone Encounter (Signed)
Theodore Webster is requesting a refill on the following medication(s):  Requested Prescriptions     Pending Prescriptions Disp Refills    amLODIPine (NORVASC) 10 MG tablet [Pharmacy Med Name: AMLODIPINE BESYLATE 10MG  TABLETS] 90 tablet 1     Sig: TAKE 1 TABLET BY MOUTH DAILY       Last Visit Date (If Applicable):  10/17/5425    Next Visit Date:    07/30/2022

## 2022-07-29 ENCOUNTER — Ambulatory Visit
Admit: 2022-07-29 | Discharge: 2022-07-29 | Payer: BLUE CROSS/BLUE SHIELD | Attending: Physician Assistant | Primary: Physician Assistant

## 2022-07-29 DIAGNOSIS — I10 Essential (primary) hypertension: Secondary | ICD-10-CM

## 2022-07-29 NOTE — Progress Notes (Signed)
[facility]  [facility]  [address]  [address]  [place] [address]  Dept: [phone]  Dept Fax: [phone]    Theodore Webster is a 66 y.o. male who presents today for his medical conditions/complaints as noted below.    Chief Complaint   Patient presents with    Hypertension       HPI:     Patient presents to the office for follow-up on hypertension.    We reviewed medications.  He is only taking amlodipine at nighttime and not hydrochlorothiazide in the morning.  He was not sure if he has this medication at home.    He denies any new or acute concerns.  No lightheadedness, dizziness, shortness of breath, chest pain, leg edema, syncope.    Encouraged completing labs to assess for other risk factors including prediabetes, hyperlipidemia, renal disease.    Hypertension  This is a chronic problem. The current episode started more than 1 year ago. The problem is unchanged. The problem is uncontrolled. Pertinent negatives include no anxiety, chest pain, headaches or shortness of breath. Risk factors for coronary artery disease include dyslipidemia, male gender and obesity. Past treatments include diuretics and calcium channel blockers. The current treatment provides mild improvement. There are no compliance problems.  Identifiable causes of hypertension include sleep apnea.       Hemoglobin A1C (%)   Date Value   04/18/2021 6.1 (H)   06/20/2020 6.1   12/14/2019 6.0             ( goal A1C is < 7)   No components found for: "LABMICR"  LDL Cholesterol (mg/dL)   Date Value   25/85/2778 92     LDL Calculated (mg/dL)   Date Value   24/23/5361 92       (goal LDL is <100)   AST (U/L)   Date Value   04/18/2021 44 (H)     ALT (U/L)   Date Value   04/18/2021 35     BUN (mg/dL)   Date Value   44/31/5400 12     BP Readings from Last 3 Encounters:   07/29/22 (!) 136/92   03/27/22 130/86   12/17/21 (!) 140/82          (goal 120/80)    Past Medical History:   Diagnosis Date     Hypertension       Past Surgical History:   Procedure Laterality Date    ACHILLES TENDON SURGERY  1992    US THYROID BIOPSY  10/04/2021    US THYROID BIOPSY 10/04/2021 [facility] ULTRASOUND       Family History   Problem Relation Age of Onset    Diabetes Mother     High Blood Pressure Mother     Cancer Father        Social History     Tobacco Use    Smoking status: Never    Smokeless tobacco: Never   Substance Use Topics    Alcohol use: Not Currently     Comment: socially      Current Outpatient Medications   Medication Sig Dispense Refill    amLODIPine (NORVASC) 10 MG tablet TAKE 1 TABLET BY MOUTH DAILY 90 tablet 1    hydroCHLOROthiazide (MICROZIDE) 12.5 MG capsule Take 1 capsule by mouth every morning 90 capsule 1     No current facility-administered medications for this visit.     Allergies   Allergen Reactions  Penicillins Other (See Comments)     Unsure of reaction, childhood allergy       Health Maintenance   Topic Date Due    HIV screen  Never done    Hepatitis C screen  Never done    DTaP/Tdap/Td vaccine (1 - Tdap) Never done    Colorectal Cancer Screen  Never done    Shingles vaccine (1 of 2) Never done    A1C test (Diabetic or Prediabetic)  04/18/2022    COVID-19 Vaccine (3 - 2023-24 season) 05/16/2022    Depression Screen  07/30/2023    Lipids  04/18/2026    Flu vaccine  Completed    Hepatitis A vaccine  Aged Out    Hepatitis B vaccine  Aged Out    Hib vaccine  Aged Out    Meningococcal (ACWY) vaccine  Aged Out    Pneumococcal 0-64 years Vaccine  Aged Out    Diabetes screen  Discontinued    Prostate Specific Antigen (PSA) Screening or Monitoring  Discontinued       Subjective:      Review of Systems   Constitutional:  Negative for chills, fatigue and fever.   HENT:  Negative for congestion, rhinorrhea and sinus pain.    Eyes:  Negative for pain.   Respiratory:  Negative for cough and shortness of breath.    Cardiovascular:  Negative for chest pain and leg swelling.   Gastrointestinal:  Negative for abdominal  pain, constipation, diarrhea, nausea and vomiting.   Genitourinary:  Negative for difficulty urinating, enuresis and testicular pain.   Musculoskeletal:  Negative for arthralgias, back pain and myalgias.   Skin:  Negative for rash.   Neurological:  Negative for dizziness and headaches.   Psychiatric/Behavioral:  Negative for confusion and sleep disturbance. The patient is not nervous/anxious.    All other systems reviewed and are negative.      Objective:     Physical Exam  Vitals and nursing note reviewed.   Constitutional:       General: He is not in acute distress.     Appearance: Normal appearance. He is obese.   HENT:      Head: Normocephalic.      Mouth/Throat:      Mouth: Mucous membranes are moist.   Eyes:      Extraocular Movements: Extraocular movements intact.      Conjunctiva/sclera: Conjunctivae normal.      Pupils: Pupils are equal, round, and reactive to light.   Cardiovascular:      Rate and Rhythm: Normal rate and regular rhythm.      Pulses: Normal pulses.      Heart sounds: Normal heart sounds.   Pulmonary:      Effort: Pulmonary effort is normal.      Breath sounds: Normal breath sounds.   Musculoskeletal:      Cervical back: Normal range of motion.      Right lower leg: No edema.      Left lower leg: No edema.   Lymphadenopathy:      Cervical: No cervical adenopathy.   Skin:     General: Skin is warm.      Capillary Refill: Capillary refill takes less than 2 seconds.   Neurological:      General: No focal deficit present.      Mental Status: He is alert and oriented to person, place, and time.   Psychiatric:         Mood and Affect: Mood normal.  Behavior: Behavior normal.       BP (!) 136/92 (Site: Left Upper Arm, Position: Sitting, Cuff Size: Large Adult)   Pulse 73   Resp 16   Ht 1.88 m (6\' 2" )   Wt 134.4 kg (296 lb 3.2 oz)   SpO2 96%   BMI 38.03 kg/m     Assessment:       ICD-10-CM    1. Primary hypertension  I10       2. Obstructive sleep apnea syndrome  G47.33       3. Class  2 severe obesity due to excess calories with serious comorbidity and body mass index (BMI) of 38.0 to 38.9 in adult (HCC)  E66.01     Z68.38                Plan:      1.  Hypertension is borderline today.  After discussion, it does not appear patient is taking his morning hydrochlorothiazide.  He is only taking amlodipine at nighttime.  Discussed the importance of both medications for hypertensive management.  He is to call the office if he does not have hydrochlorothiazide at home.  Additionally, recommend obtaining home blood pressure cuff for frequent monitoring.  2.  Continue daily management for obstructive sleep apnea.  3.  I encouraged healthy dietary and physical activity for weight management.  Discussed weight and its impact on chronic medical illness/risk.    Return in about 4 months (around 11/27/2022) for medication recheck, blood pressure.    No orders of the defined types were placed in this encounter.        Patient given educational materials - see patient instructions.  Discussed use, benefit, and side effects of prescribed medications.  All patient questions answered. Pt voiced understanding. Reviewed health maintenance.  Instructed to continue current medications, diet and exercise.  Patient agreed with treatment plan. Follow up as directed.        Electronically signed by 11/29/2022, PA-C on 07/30/2022 at 7:22 AM

## 2022-07-30 ENCOUNTER — Encounter: Payer: BLUE CROSS/BLUE SHIELD | Attending: Physician Assistant | Primary: Physician Assistant

## 2022-07-31 ENCOUNTER — Inpatient Hospital Stay: Payer: BLUE CROSS/BLUE SHIELD | Primary: Physician Assistant

## 2022-07-31 DIAGNOSIS — I10 Essential (primary) hypertension: Secondary | ICD-10-CM

## 2022-07-31 DIAGNOSIS — Z125 Encounter for screening for malignant neoplasm of prostate: Secondary | ICD-10-CM

## 2022-07-31 LAB — LIPID PANEL
Chol/HDL Ratio: 3.9 (ref <5)
Cholesterol: 165 mg/dL (ref <200)
HDL: 42 mg/dL (ref >40)
LDL Cholesterol: 108 mg/dL (ref 0–130)
Triglycerides: 73 mg/dL (ref <150)

## 2022-07-31 LAB — COMPREHENSIVE METABOLIC PANEL
ALT: 22 U/L (ref 5–41)
AST: 24 U/L (ref <40)
Albumin/Globulin Ratio: 1 (ref 1.0–2.5)
Albumin: 3.8 g/dL (ref 3.5–5.2)
Alkaline Phosphatase: 69 U/L (ref 40–129)
Anion Gap: 8 mmol/L — ABNORMAL LOW (ref 9–17)
BUN: 12 mg/dL (ref 8–23)
CO2: 27 mmol/L (ref 20–31)
Calcium: 8.9 mg/dL (ref 8.6–10.4)
Chloride: 105 mmol/L (ref 98–107)
Creatinine: 1.1 mg/dL (ref 0.7–1.2)
Est, Glom Filt Rate: >60 mL/min/{1.73_m2} (ref >60)
Glucose: 94 mg/dL (ref 70–99)
Potassium: 4 mmol/L (ref 3.7–5.3)
Sodium: 140 mmol/L (ref 135–144)
Total Bilirubin: 0.5 mg/dL (ref 0.3–1.2)
Total Protein: 7.7 g/dL (ref 6.4–8.3)

## 2022-07-31 LAB — CBC
Hematocrit: 49.6 % (ref 40.7–50.3)
Hemoglobin: 15.4 g/dL (ref 13.0–17.0)
MCH: 26.8 pg (ref 25.2–33.5)
MCHC: 31 g/dL (ref 28.4–34.8)
MCV: 86.3 fL (ref 82.6–102.9)
MPV: 10.7 fL (ref 8.1–13.5)
NRBC Automated: 0 per 100 WBC
Platelets: 152 10*3/uL (ref 138–453)
RBC: 5.75 m/uL (ref 4.21–5.77)
RDW: 15.3 % — ABNORMAL HIGH (ref 11.8–14.4)
WBC: 6.4 10*3/uL (ref 3.5–11.3)

## 2022-07-31 LAB — HEMOGLOBIN A1C
Estimated Avg Glucose: 123 mg/dL
Hemoglobin A1C: 5.9 % (ref 4.0–6.0)

## 2022-07-31 LAB — PSA SCREENING: PSA: 0.74 ng/mL (ref <4.1)

## 2022-07-31 NOTE — Telephone Encounter (Signed)
Pt said to let you know that it has been 5 years since his last colonoscopy

## 2022-07-31 NOTE — Telephone Encounter (Signed)
I believe I gave [place] information to track down colonoscopy from [facility].  Please try to obtain records.  If necessary will refer for new colonoscopy.

## 2022-07-31 NOTE — Telephone Encounter (Signed)
Sent fax to place where Pt got it done, phone [phone], fax [phone]

## 2022-11-27 ENCOUNTER — Encounter: Payer: BLUE CROSS/BLUE SHIELD | Attending: Physician Assistant | Primary: Physician Assistant
